# Patient Record
Sex: Female | Born: 1965 | Race: White | Hispanic: No | Marital: Married | Smoking: Current every day smoker
Health system: Southern US, Community
[De-identification: ages and names within clinical notes are randomized; demographics above are authoritative.]

## PROBLEM LIST (undated history)

## (undated) DIAGNOSIS — R29898 Other symptoms and signs involving the musculoskeletal system: Secondary | ICD-10-CM

## (undated) DIAGNOSIS — R112 Nausea with vomiting, unspecified: Secondary | ICD-10-CM

## (undated) DIAGNOSIS — Z973 Presence of spectacles and contact lenses: Secondary | ICD-10-CM

## (undated) DIAGNOSIS — Z9889 Other specified postprocedural states: Secondary | ICD-10-CM

## (undated) DIAGNOSIS — W57XXXA Bitten or stung by nonvenomous insect and other nonvenomous arthropods, initial encounter: Secondary | ICD-10-CM

## (undated) DIAGNOSIS — G4730 Sleep apnea, unspecified: Secondary | ICD-10-CM

## (undated) DIAGNOSIS — E785 Hyperlipidemia, unspecified: Secondary | ICD-10-CM

## (undated) DIAGNOSIS — T7840XA Allergy, unspecified, initial encounter: Secondary | ICD-10-CM

## (undated) DIAGNOSIS — E669 Obesity, unspecified: Secondary | ICD-10-CM

## (undated) DIAGNOSIS — K219 Gastro-esophageal reflux disease without esophagitis: Secondary | ICD-10-CM

## (undated) HISTORY — DX: Bitten or stung by nonvenomous insect and other nonvenomous arthropods, initial encounter: W57.XXXA

## (undated) HISTORY — PX: LAPAROSCOPIC GASTRIC SLEEVE RESECTION: SHX5895

## (undated) HISTORY — PX: TUBAL LIGATION: SHX77

## (undated) HISTORY — DX: Hyperlipidemia, unspecified: E78.5

## (undated) HISTORY — PX: OOPHORECTOMY: SHX86

## (undated) HISTORY — DX: Gastro-esophageal reflux disease without esophagitis: K21.9

## (undated) HISTORY — DX: Allergy, unspecified, initial encounter: T78.40XA

## (undated) HISTORY — PX: COSMETIC SURGERY: SHX468

## (undated) HISTORY — PX: NASAL SINUS SURGERY: SHX719

## (undated) HISTORY — PX: FOOT SURGERY: SHX648

## (undated) HISTORY — PX: HERNIA REPAIR: SHX51

---

## 1993-02-23 HISTORY — PX: VAGINAL HYSTERECTOMY: SUR661

## 1997-12-28 ENCOUNTER — Other Ambulatory Visit: Admission: RE | Admit: 1997-12-28 | Discharge: 1997-12-28 | Payer: Self-pay

## 2005-04-10 ENCOUNTER — Emergency Department: Payer: Self-pay | Admitting: Emergency Medicine

## 2005-06-03 ENCOUNTER — Ambulatory Visit: Payer: Self-pay | Admitting: Obstetrics and Gynecology

## 2006-04-26 ENCOUNTER — Ambulatory Visit: Payer: Self-pay | Admitting: Otolaryngology

## 2006-10-12 ENCOUNTER — Ambulatory Visit: Payer: Self-pay | Admitting: Family Medicine

## 2006-12-29 ENCOUNTER — Ambulatory Visit: Payer: Self-pay | Admitting: Family Medicine

## 2007-03-13 ENCOUNTER — Other Ambulatory Visit: Payer: Self-pay

## 2007-03-13 ENCOUNTER — Emergency Department: Payer: Self-pay | Admitting: Emergency Medicine

## 2008-07-04 ENCOUNTER — Ambulatory Visit: Payer: Self-pay | Admitting: Gastroenterology

## 2008-07-17 ENCOUNTER — Ambulatory Visit: Payer: Self-pay | Admitting: Gastroenterology

## 2008-12-23 ENCOUNTER — Emergency Department: Payer: Self-pay | Admitting: Unknown Physician Specialty

## 2009-04-11 ENCOUNTER — Emergency Department: Payer: Self-pay | Admitting: Emergency Medicine

## 2010-02-06 ENCOUNTER — Ambulatory Visit: Payer: Self-pay | Admitting: Family Medicine

## 2010-08-07 ENCOUNTER — Ambulatory Visit: Payer: Self-pay | Admitting: Family Medicine

## 2010-09-26 ENCOUNTER — Ambulatory Visit: Payer: Self-pay | Admitting: Urology

## 2010-11-13 ENCOUNTER — Ambulatory Visit: Payer: Self-pay | Admitting: Family Medicine

## 2011-04-21 ENCOUNTER — Ambulatory Visit: Payer: Self-pay | Admitting: Family Medicine

## 2011-08-01 ENCOUNTER — Emergency Department: Payer: Self-pay | Admitting: Emergency Medicine

## 2011-08-09 ENCOUNTER — Emergency Department: Payer: Self-pay | Admitting: Emergency Medicine

## 2011-08-11 ENCOUNTER — Ambulatory Visit: Payer: Self-pay | Admitting: Family Medicine

## 2012-04-27 ENCOUNTER — Ambulatory Visit: Payer: Self-pay | Admitting: Family Medicine

## 2012-06-20 LAB — HM PAP SMEAR: HM Pap smear: NORMAL

## 2013-05-02 ENCOUNTER — Ambulatory Visit: Payer: Self-pay | Admitting: Family Medicine

## 2013-05-02 LAB — HM MAMMOGRAPHY: HM Mammogram: NORMAL

## 2013-12-05 ENCOUNTER — Ambulatory Visit: Payer: Self-pay | Admitting: Family Medicine

## 2014-06-20 ENCOUNTER — Other Ambulatory Visit: Payer: Self-pay | Admitting: Family Medicine

## 2014-06-20 DIAGNOSIS — Z1231 Encounter for screening mammogram for malignant neoplasm of breast: Secondary | ICD-10-CM

## 2014-06-20 LAB — LIPID PANEL
Cholesterol: 186 mg/dL (ref 0–200)
HDL: 38 mg/dL (ref 35–70)
LDL CALC: 127 mg/dL
Triglycerides: 107 mg/dL (ref 40–160)

## 2014-06-20 LAB — HEPATIC FUNCTION PANEL
ALT: 57 U/L — AB (ref 7–35)
AST: 46 U/L — AB (ref 13–35)

## 2014-06-20 LAB — BASIC METABOLIC PANEL
BUN: 11 mg/dL (ref 4–21)
Creatinine: 0.6 mg/dL (ref <=1.1)
Glucose: 100 mg/dL

## 2014-07-06 DIAGNOSIS — R030 Elevated blood-pressure reading, without diagnosis of hypertension: Secondary | ICD-10-CM | POA: Insufficient documentation

## 2014-07-06 DIAGNOSIS — G5600 Carpal tunnel syndrome, unspecified upper limb: Secondary | ICD-10-CM | POA: Insufficient documentation

## 2014-07-06 DIAGNOSIS — Z0000 Encounter for general adult medical examination without abnormal findings: Secondary | ICD-10-CM | POA: Insufficient documentation

## 2014-07-06 DIAGNOSIS — J449 Chronic obstructive pulmonary disease, unspecified: Secondary | ICD-10-CM | POA: Insufficient documentation

## 2014-07-06 DIAGNOSIS — B079 Viral wart, unspecified: Secondary | ICD-10-CM | POA: Insufficient documentation

## 2014-07-06 DIAGNOSIS — E7849 Other hyperlipidemia: Secondary | ICD-10-CM | POA: Insufficient documentation

## 2014-07-06 DIAGNOSIS — J440 Chronic obstructive pulmonary disease with acute lower respiratory infection: Secondary | ICD-10-CM

## 2014-07-06 DIAGNOSIS — E668 Other obesity: Secondary | ICD-10-CM | POA: Insufficient documentation

## 2014-07-06 DIAGNOSIS — F17200 Nicotine dependence, unspecified, uncomplicated: Secondary | ICD-10-CM | POA: Insufficient documentation

## 2014-07-09 ENCOUNTER — Ambulatory Visit: Payer: Self-pay

## 2014-07-11 ENCOUNTER — Ambulatory Visit
Admission: RE | Admit: 2014-07-11 | Discharge: 2014-07-11 | Disposition: A | Discharge status: Home / Self Care | Payer: 59 | Source: Ambulatory Visit | Attending: Family Medicine | Admitting: Family Medicine

## 2014-07-11 DIAGNOSIS — Z1231 Encounter for screening mammogram for malignant neoplasm of breast: Secondary | ICD-10-CM | POA: Diagnosis not present

## 2014-08-06 ENCOUNTER — Other Ambulatory Visit: Payer: 59

## 2014-08-06 DIAGNOSIS — R945 Abnormal results of liver function studies: Principal | ICD-10-CM

## 2014-08-06 DIAGNOSIS — R7989 Other specified abnormal findings of blood chemistry: Secondary | ICD-10-CM

## 2014-08-07 LAB — HEPATIC FUNCTION PANEL
ALK PHOS: 74 IU/L (ref 39–117)
ALT: 44 IU/L — ABNORMAL HIGH (ref 0–32)
AST: 28 IU/L (ref 0–40)
Albumin: 4 g/dL (ref 3.5–5.5)
Bilirubin Total: 0.2 mg/dL (ref 0.0–1.2)
Bilirubin, Direct: 0.08 mg/dL (ref 0.00–0.40)
TOTAL PROTEIN: 6.8 g/dL (ref 6.0–8.5)

## 2014-11-15 ENCOUNTER — Ambulatory Visit
Admission: RE | Admit: 2014-11-15 | Discharge: 2014-11-15 | Disposition: A | Discharge status: Home / Self Care | Payer: 59 | Source: Ambulatory Visit | Attending: Family Medicine | Admitting: Family Medicine

## 2014-11-15 ENCOUNTER — Ambulatory Visit (INDEPENDENT_AMBULATORY_CARE_PROVIDER_SITE_OTHER): Payer: 59 | Admitting: Family Medicine

## 2014-11-15 ENCOUNTER — Encounter: Payer: Self-pay | Admitting: Family Medicine

## 2014-11-15 VITALS — BP 110/70 | HR 90 | Temp 98.5°F | Ht 66.0 in | Wt 275.0 lb

## 2014-11-15 DIAGNOSIS — J440 Chronic obstructive pulmonary disease with acute lower respiratory infection: Secondary | ICD-10-CM

## 2014-11-15 DIAGNOSIS — J40 Bronchitis, not specified as acute or chronic: Secondary | ICD-10-CM

## 2014-11-15 MED ORDER — GUAIFENESIN-CODEINE 100-10 MG/5ML PO SOLN: 5.0000 mL | Freq: Three times a day (TID) | ORAL | Status: DC | PRN

## 2014-11-15 MED ORDER — ALBUTEROL SULFATE (2.5 MG/3ML) 0.083% IN NEBU: 2.5000 mg | INHALATION_SOLUTION | Freq: Four times a day (QID) | RESPIRATORY_TRACT | Status: DC | PRN

## 2014-11-15 MED ORDER — LEVOFLOXACIN 500 MG PO TABS: 500.0000 mg | ORAL_TABLET | Freq: Every day | ORAL | Status: DC

## 2014-11-15 NOTE — Progress Notes (Signed)
Name: Sarah Pham   MRN: [id number]    DOB: Jul 03, 1965   Date:11/15/2014       Progress Note  Subjective  Chief Complaint  Chief Complaint  Patient presents with  . Cough    productive cough- brown , wheezing and fever/ chills    Cough This is a new problem. The current episode started in the past 7 days. The problem has been waxing and waning. The cough is productive of purulent sputum. Associated symptoms include chills, a fever, nasal congestion, sweats and wheezing. Pertinent negatives include no chest pain, ear congestion, ear pain, headaches, heartburn, myalgias, postnasal drip, rash, sore throat, shortness of breath or weight loss. The treatment provided mild relief. Her past medical history is significant for bronchitis and COPD. There is no history of environmental allergies.    No problem-specific assessment & plan notes found for this encounter.   Past Medical History  Diagnosis Date  . Hyperlipidemia     Past Surgical History  Procedure Laterality Date  . Foot surgery    . Nasal sinus surgery    . Vaginal hysterectomy  1995    Family History  Problem Relation Age of Onset  . Breast cancer Maternal Aunt   . Breast cancer Maternal Grandmother     Social History   Social History  . Marital Status: Married    Spouse Name: N/A  . Number of Children: N/A  . Years of Education: N/A   Occupational History  . Not on file.   Social History Main Topics  . Smoking status: Current Every Day Smoker  . Smokeless tobacco: Not on file  . Alcohol Use: 0.0 oz/week    0 Standard drinks or equivalent per week  . Drug Use: No  . Sexual Activity: Yes   Other Topics Concern  . Not on file   Social History Narrative    Allergies  Allergen Reactions  . Other Other (See Comments)    Tomatoes, dust and mold  . Sulfa Antibiotics Other (See Comments)  . Tomato      Review of Systems  Constitutional: Positive for fever and chills. Negative for weight loss and  malaise/fatigue.  HENT: Negative for ear discharge, ear pain, postnasal drip and sore throat.   Eyes: Negative for blurred vision.  Respiratory: Positive for cough and wheezing. Negative for sputum production and shortness of breath.   Cardiovascular: Negative for chest pain, palpitations and leg swelling.  Gastrointestinal: Negative for heartburn, nausea, abdominal pain, diarrhea, constipation, blood in stool and melena.  Genitourinary: Negative for dysuria, urgency, frequency and hematuria.  Musculoskeletal: Negative for myalgias, back pain, joint pain and neck pain.  Skin: Negative for rash.  Neurological: Negative for dizziness, tingling, sensory change, focal weakness and headaches.  Endo/Heme/Allergies: Negative for environmental allergies and polydipsia. Does not bruise/bleed easily.  Psychiatric/Behavioral: Negative for depression and suicidal ideas. The patient is not nervous/anxious and does not have insomnia.      Objective  Filed Vitals:   11/15/14 0956  BP: 110/70  Pulse: 90  Temp: 98.5 F (36.9 C)  TempSrc: Oral  Height: 5\' 6"  (1.676 m)  Weight: 275 lb (124.739 kg)  SpO2: 94%    Physical Exam  Constitutional: She is well-developed, well-nourished, and in no distress. No distress.  HENT:  Head: Normocephalic and atraumatic.  Right Ear: External ear normal.  Left Ear: External ear normal.  Nose: Nose normal.  Mouth/Throat: Oropharynx is clear and moist.  Eyes: Conjunctivae and EOM are normal. Pupils  are equal, round, and reactive to light. Right eye exhibits no discharge. Left eye exhibits no discharge.  Neck: Normal range of motion. Neck supple. No JVD present. No thyromegaly present.  Cardiovascular: Normal rate, regular rhythm, normal heart sounds and intact distal pulses.  Exam reveals no gallop and no friction rub.   No murmur heard. Pulmonary/Chest: Effort normal and breath sounds normal.  Abdominal: Soft. Bowel sounds are normal. She exhibits no mass.  There is no tenderness. There is no guarding.  Musculoskeletal: Normal range of motion. She exhibits no edema.  Lymphadenopathy:    She has no cervical adenopathy.  Neurological: She is alert. She has normal reflexes.  Skin: Skin is warm and dry. She is not diaphoretic.  Psychiatric: Mood and affect normal.      Assessment & Plan  Problem List Items Addressed This Visit      Respiratory   Chronic obstructive pulmonary disease with acute lower resp infection - Primary   Relevant Medications   albuterol (PROVENTIL) (2.5 MG/3ML) 0.083% nebulizer solution   guaiFENesin-codeine 100-10 MG/5ML syrup   Other Relevant Orders   DG Chest 2 View    Other Visit Diagnoses    Bronchitis        Relevant Medications    levofloxacin (LEVAQUIN) 500 MG tablet    guaiFENesin-codeine 100-10 MG/5ML syrup         Dr. Macon [facility]  11/15/2014

## 2015-04-11 ENCOUNTER — Ambulatory Visit
Admission: EM | Admit: 2015-04-11 | Discharge: 2015-04-11 | Disposition: A | Discharge status: Home / Self Care | Payer: 59 | Attending: Family Medicine | Admitting: Family Medicine

## 2015-04-11 DIAGNOSIS — N393 Stress incontinence (female) (male): Secondary | ICD-10-CM

## 2015-04-11 DIAGNOSIS — J0100 Acute maxillary sinusitis, unspecified: Secondary | ICD-10-CM

## 2015-04-11 DIAGNOSIS — R05 Cough: Secondary | ICD-10-CM | POA: Diagnosis not present

## 2015-04-11 DIAGNOSIS — R059 Cough, unspecified: Secondary | ICD-10-CM

## 2015-04-11 HISTORY — DX: Obesity, unspecified: E66.9

## 2015-04-11 MED ORDER — CETIRIZINE-PSEUDOEPHEDRINE ER 5-120 MG PO TB12: 1.0000 | ORAL_TABLET | Freq: Two times a day (BID) | ORAL | Status: DC | PRN

## 2015-04-11 MED ORDER — FLUTICASONE PROPIONATE 50 MCG/ACT NA SUSP: 2.0000 | Freq: Every day | NASAL | Status: DC

## 2015-04-11 MED ORDER — AMOXICILLIN-POT CLAVULANATE 875-125 MG PO TABS: 1.0000 | ORAL_TABLET | Freq: Two times a day (BID) | ORAL | Status: DC

## 2015-04-11 MED ORDER — HYDROCOD POLST-CPM POLST ER 10-8 MG/5ML PO SUER: 5.0000 mL | Freq: Two times a day (BID) | ORAL | Status: DC | PRN

## 2015-04-11 NOTE — ED Notes (Signed)
Facial pain, mucus, cough.   Started "feeling bad" Sunday.  Cough started Tuesday.  Yellow and green mucus reported.  Unknown sick contacts.

## 2015-04-11 NOTE — ED Provider Notes (Addendum)
CSN: [id number]     Arrival date & time 04/11/15  20 History   First MD Initiated Contact with Patient 04/11/15 1928    Nurses notes were reviewed. Chief Complaint  Patient presents with  . Facial Pain  . Cough   Patient is here because of sinus pain and pressure. She states everything started about 5 days ago but in the last 72 hours things have gotten worse. She reports nasal congestion irritation without now she started to cough and with cough she's having some urinary incontinence. She states is green which she is blowing out of her nose and is getting thicker. She is unable to really bring much out from her lungs. Unfortunately she has a history hyperlipidemia as well as obesity maternal aunt and grandmother both had breast cancer. She does smoke on a daily basis pack a day.    (Consider location/radiation/quality/duration/timing/severity/associated sxs/prior Treatment) Patient is a 50 y.o. female presenting with cough. The history is provided by the patient. No language interpreter was used.  Cough Cough characteristics:  Productive and non-productive Sputum characteristics:  Green Severity:  Moderate Timing:  Constant Progression:  Worsening Chronicity:  New Smoker: yes   Context: smoke exposure and upper respiratory infection   Context: not occupational exposure   Relieved by:  Nothing Worsened by:  Environmental changes Ineffective treatments:  Decongestant and cough suppressants Associated symptoms: ear pain, rhinorrhea and sinus congestion   Associated symptoms: no chest pain, no myalgias, no rash, no shortness of breath and no sore throat   Risk factors: recent infection   Risk factors: no chemical exposure and no recent travel     Past Medical History  Diagnosis Date  . Hyperlipidemia   . Obesity    Past Surgical History  Procedure Laterality Date  . Foot surgery    . Nasal sinus surgery    . Vaginal hysterectomy  1995   Family History  Problem Relation Age  of Onset  . Breast cancer Maternal Aunt   . Breast cancer Maternal Grandmother    Social History  Substance Use Topics  . Smoking status: Current Every Day Smoker -- 1.00 packs/day    Types: Cigarettes  . Smokeless tobacco: None  . Alcohol Use: No   OB History    No data available     Review of Systems  HENT: Positive for ear pain and rhinorrhea. Negative for sore throat.   Respiratory: Positive for cough. Negative for shortness of breath.   Cardiovascular: Negative for chest pain.  Musculoskeletal: Negative for myalgias.  Skin: Negative for rash.  All other systems reviewed and are negative.   Allergies  Other; Sulfa antibiotics; and Tomato  Home Medications   Prior to Admission medications   Medication Sig Start Date End Date Taking? Authorizing Provider  cetirizine (ZYRTEC) 10 MG tablet Take 1 tablet by mouth daily.   Yes Historical Provider, MD  albuterol (PROVENTIL) (2.5 MG/3ML) 0.083% nebulizer solution Take 3 mLs (2.5 mg total) by nebulization every 6 (six) hours as needed for wheezing or shortness of breath. 11/15/14   Juline Patch, MD  amoxicillin-clavulanate (AUGMENTIN) 875-125 MG tablet Take 1 tablet by mouth 2 (two) times daily. 04/11/15   Frederich Cha, MD  cetirizine-pseudoephedrine (ZYRTEC-D) 5-120 MG tablet Take 1 tablet by mouth 2 (two) times daily as needed for allergies. 04/11/15   Frederich Cha, MD  chlorpheniramine-HYDROcodone [facility] PENNKINETIC ER) 10-8 MG/5ML SUER Take 5 mLs by mouth every 12 (twelve) hours as needed for cough. 04/11/15  Frederich Cha, MD  fluticasone [facility]) 50 MCG/ACT nasal spray Place 2 sprays into both nostrils daily. 04/11/15   Frederich Cha, MD  guaiFENesin-codeine 100-10 MG/5ML syrup Take 5 mLs by mouth 3 (three) times daily as needed. 11/15/14   Juline Patch, MD  levofloxacin (LEVAQUIN) 500 MG tablet Take 1 tablet (500 mg total) by mouth daily. 11/15/14   Juline Patch, MD   Meds Ordered and Administered this Visit  Medications -  No data to display  BP 128/88 mmHg  Pulse 94  Temp(Src) 97.9 F (36.6 C) (Oral)  Resp 16  SpO2 100% No data found.   Physical Exam  Constitutional: She appears well-developed and well-nourished.  HENT:  Head: Normocephalic and atraumatic.  Right Ear: Hearing, tympanic membrane, external ear and ear canal normal.  Left Ear: Hearing, external ear and ear canal normal. Tympanic membrane is erythematous.  Nose: Mucosal edema and rhinorrhea present. Right sinus exhibits maxillary sinus tenderness.  Mouth/Throat: No dental caries. Posterior oropharyngeal erythema present.  Eyes: Pupils are equal, round, and reactive to light.  Neck: Neck supple. No tracheal deviation present.  Cardiovascular: Normal rate and regular rhythm.   Pulmonary/Chest: Effort normal and breath sounds normal. No respiratory distress.  Musculoskeletal: Normal range of motion.  Lymphadenopathy:    She has cervical adenopathy.  Neurological: She is alert.  Skin: Skin is warm.  Psychiatric: She has a normal mood and affect.  Vitals reviewed.   ED Course  Procedures (including critical care time)  Labs Review Labs Reviewed - No data to display  Imaging Review No results found.   Visual Acuity Review  Right Eye Distance:   Left Eye Distance:   Bilateral Distance:    Right Eye Near:   Left Eye Near:    Bilateral Near:         MDM   1. Acute maxillary sinusitis, recurrence not specified   2. Cough   3. SI (stress incontinence), female    Explained patient normally we don't treat sinus infections about 7-10 days been present she looks somewhat miserable and because of the urinary incontinence. Will treat with Augmentin 875 one tablet twice a day Allegra-D and Flonase nasal spray and Tussionex 1 teaspoon twice a day.    Frederich Cha, MD 04/11/15 Lona Kettle  Frederich Cha, MD 04/11/15 [phone]

## 2015-04-11 NOTE — Discharge Instructions (Signed)
Cough, Adult A cough helps to clear your throat and lungs. A cough may last only 2-3 weeks (acute), or it may last longer than 8 weeks (chronic). Many different things can cause a cough. A cough may be a sign of an illness or another medical condition. HOME CARE  Pay attention to any changes in your cough.  Take medicines only as told by your doctor.  If you were prescribed an antibiotic medicine, take it as told by your doctor. Do not stop taking it even if you start to feel better.  Talk with your doctor before you try using a cough medicine.  Drink enough fluid to keep your pee (urine) clear or pale yellow.  If the air is dry, use a cold steam vaporizer or humidifier in your home.  Stay away from things that make you cough at work or at home.  If your cough is worse at night, try using extra pillows to raise your head up higher while you sleep.  Do not smoke, and try not to be around smoke. If you need help quitting, ask your doctor.  Do not have caffeine.  Do not drink alcohol.  Rest as needed. GET HELP IF: 1. You have new problems (symptoms). 2. You cough up yellow fluid (pus). 3. Your cough does not get better after 2-3 weeks, or your cough gets worse. 4. Medicine does not help your cough and you are not sleeping well. 5. You have pain that gets worse or pain that is not helped with medicine. 6. You have a fever. 7. You are losing weight and you do not know why. 8. You have night sweats. GET HELP RIGHT AWAY IF:  You cough up blood.  You have trouble breathing.  Your heartbeat is very fast.   This information is not intended to replace advice given to you by your health care provider. Make sure you discuss any questions you have with your health care provider.   Document Released: 10/23/2010 Document Revised: 10/31/2014 Document Reviewed: 04/18/2014 Elsevier Interactive Patient Education 2016 [employer]  Sinusitis, Adult Sinusitis is redness, soreness, and  puffiness (inflammation) of the air pockets in the bones of your face (sinuses). The redness, soreness, and puffiness can cause air and mucus to get trapped in your sinuses. This can allow germs to grow and cause an infection.  HOME CARE   Drink enough fluids to keep your pee (urine) clear or pale yellow.  Use a humidifier in your home.  Run a hot shower to create steam in the bathroom. Sit in the bathroom with the door closed. Breathe in the steam 3-4 times a day.  Put a warm, moist washcloth on your face 3-4 times a day, or as told by your doctor.  Use salt water sprays (saline sprays) to wet the thick fluid in your nose. This can help the sinuses drain.  Only take medicine as told by your doctor. GET HELP RIGHT AWAY IF:  9. Your pain gets worse. 10. You have very bad headaches. 11. You are sick to your stomach (nauseous). 12. You throw up (vomit). 13. You are very sleepy (drowsy) all the time. 14. Your face is puffy (swollen). 15. Your vision changes. 16. You have a stiff neck. 17. You have trouble breathing. MAKE SURE YOU:   Understand these instructions.  Will watch your condition.  Will get help right away if you are not doing well or get worse.   This information is not intended to replace advice  given to you by your health care provider. Make sure you discuss any questions you have with your health care provider.   Document Released: 07/29/2007 Document Revised: 03/02/2014 Document Reviewed: 09/15/2011 Elsevier Interactive Patient Education 2016 Elsevier Inc.  Urinary Incontinence Urinary incontinence is the involuntary loss of urine from your bladder. CAUSES  There are many causes of urinary incontinence. They include:  Medicines.  Infections.  Prostatic enlargement, leading to overflow of urine from your bladder.  Surgery.  Neurological diseases.  Emotional factors. SIGNS AND SYMPTOMS Urinary Incontinence can be divided into four types: 18. Urge  incontinence. Urge incontinence is the involuntary loss of urine before you have the opportunity to go to the bathroom. There is a sudden urge to void but not enough time to reach a bathroom. 19. Stress incontinence. Stress incontinence is the sudden loss of urine with any activity that forces urine to pass. It is commonly caused by anatomical changes to the pelvis and sphincter areas of your body. 20. Overflow incontinence. Overflow incontinence is the loss of urine from an obstructed opening to your bladder. This results in a backup of urine and a resultant buildup of pressure within the bladder. When the pressure within the bladder exceeds the closing pressure of the sphincter, the urine overflows, which causes incontinence, similar to water overflowing a dam. 21. Total incontinence. Total incontinence is the loss of urine as a result of the inability to store urine within your bladder. DIAGNOSIS  Evaluating the cause of incontinence may require:  A thorough and complete medical and obstetric history.  A complete physical exam.  Laboratory tests such as a urine culture and sensitivities. When additional tests are indicated, they can include:  An ultrasound exam.  Kidney and bladder X-rays.  Cystoscopy. This is an exam of the bladder using a narrow scope.  Urodynamic testing to test the nerve function to the bladder and sphincter areas. TREATMENT  Treatment for urinary incontinence depends on the cause:  For urge incontinence caused by a bacterial infection, antibiotics will be prescribed. If the urge incontinence is related to medicines you take, your health care provider may have you change the medicine.  For stress incontinence, surgery to re-establish anatomical support to the bladder or sphincter, or both, will often correct the condition.  For overflow incontinence caused by an enlarged prostate, an operation to open the channel through the enlarged prostate will allow the flow of  urine out of the bladder. In women with fibroids, a hysterectomy may be recommended.  For total incontinence, surgery on your urinary sphincter may help. An artificial urinary sphincter (an inflatable cuff placed around the urethra) may be required. In women who have developed a hole-like passage between their bladder and vagina (vesicovaginal fistula), surgery to close the fistula often is required. HOME CARE INSTRUCTIONS  Normal daily hygiene and the use of pads or adult diapers that are changed regularly will help prevent odors and skin damage.  Avoid caffeine. It can overstimulate your bladder.  Use the bathroom regularly. Try about every 2-3 hours to go to the bathroom, even if you do not feel the need to do so. Take time to empty your bladder completely. After urinating, wait a minute. Then try to urinate again.  For causes involving nerve dysfunction, keep a log of the medicines you take and a journal of the times you go to the bathroom. SEEK MEDICAL CARE IF:  You experience worsening of pain instead of improvement in pain after your procedure.  Your  incontinence becomes worse instead of better. SEE IMMEDIATE MEDICAL CARE IF:  You experience fever or shaking chills.  You are unable to pass your urine.  You have redness spreading into your groin or down into your thighs. MAKE SURE YOU:   Understand these instructions.   Will watch your condition.  Will get help right away if you are not doing well or get worse.   This information is not intended to replace advice given to you by your health care provider. Make sure you discuss any questions you have with your health care provider.   Document Released: 03/19/2004 Document Revised: 03/02/2014 Document Reviewed: 07/19/2012 Elsevier Interactive Patient Education [employer].

## 2015-04-11 NOTE — ED Notes (Signed)
Ambulatory to TR.  In NAD.  Not photophobic. Reporting headpain.

## 2015-07-24 ENCOUNTER — Ambulatory Visit
Admission: EM | Admit: 2015-07-24 | Discharge: 2015-07-24 | Disposition: A | Discharge status: Home / Self Care | Payer: 59 | Attending: Family Medicine | Admitting: Family Medicine

## 2015-07-24 DIAGNOSIS — J411 Mucopurulent chronic bronchitis: Secondary | ICD-10-CM | POA: Diagnosis not present

## 2015-07-24 DIAGNOSIS — R062 Wheezing: Secondary | ICD-10-CM

## 2015-07-24 DIAGNOSIS — L0292 Furuncle, unspecified: Secondary | ICD-10-CM | POA: Diagnosis not present

## 2015-07-24 MED ORDER — PREDNISONE 20 MG PO TABS: ORAL_TABLET | ORAL | Status: DC

## 2015-07-24 MED ORDER — DOXYCYCLINE HYCLATE 100 MG PO TABS: 100.0000 mg | ORAL_TABLET | Freq: Two times a day (BID) | ORAL | Status: DC

## 2015-07-24 MED ORDER — GUAIFENESIN-CODEINE 100-10 MG/5ML PO SOLN: 10.0000 mL | Freq: Four times a day (QID) | ORAL | Status: DC | PRN

## 2015-07-24 MED ORDER — ALBUTEROL SULFATE HFA 108 (90 BASE) MCG/ACT IN AERS: 1.0000 | INHALATION_SPRAY | Freq: Four times a day (QID) | RESPIRATORY_TRACT | Status: DC | PRN

## 2015-07-24 NOTE — ED Notes (Signed)
Patient complains of cough-productive, congestion, wheezing. Patient states that symptoms started 2 days ago. Patient states that she also has an abscess on her side where her bra lays.

## 2015-07-24 NOTE — Discharge Instructions (Signed)
Chronic Obstructive Pulmonary Disease Chronic obstructive pulmonary disease (COPD) is a common lung condition in which airflow from the lungs is limited. COPD is a general term that can be used to describe many different lung problems that limit airflow, including both chronic bronchitis and emphysema. If you have COPD, your lung function will probably never return to normal, but there are measures you can take to improve lung function and make yourself feel better. CAUSES   Smoking (common).  Exposure to secondhand smoke.  Genetic problems.  Chronic inflammatory lung diseases or recurrent infections. SYMPTOMS  Shortness of breath, especially with physical activity.  Deep, persistent (chronic) cough with a large amount of thick mucus.  Wheezing.  Rapid breaths (tachypnea).  Gray or bluish discoloration (cyanosis) of the skin, especially in your fingers, toes, or lips.  Fatigue.  Weight loss.  Frequent infections or episodes when breathing symptoms become much worse (exacerbations).  Chest tightness. DIAGNOSIS Your health care provider will take a medical history and perform a physical examination to diagnose COPD. Additional tests for COPD may include:  Lung (pulmonary) function tests.  Chest X-ray.  CT scan.  Blood tests. TREATMENT  Treatment for COPD may include:  Inhaler and nebulizer medicines. These help manage the symptoms of COPD and make your breathing more comfortable.  Supplemental oxygen. Supplemental oxygen is only helpful if you have a low oxygen level in your blood.  Exercise and physical activity. These are beneficial for nearly all people with COPD.  Lung surgery or transplant.  Nutrition therapy to gain weight, if you are underweight.  Pulmonary rehabilitation. This may involve working with a team of health care providers and specialists, such as respiratory, occupational, and physical therapists. HOME CARE INSTRUCTIONS  Take all medicines  (inhaled or pills) as directed by your health care provider.  Avoid over-the-counter medicines or cough syrups that dry up your airway (such as antihistamines) and slow down the elimination of secretions unless instructed otherwise by your health care provider.  If you are a smoker, the most important thing that you can do is stop smoking. Continuing to smoke will cause further lung damage and breathing trouble. Ask your health care provider for help with quitting smoking. He or she can direct you to community resources or hospitals that provide support.  Avoid exposure to irritants such as smoke, chemicals, and fumes that aggravate your breathing.  Use oxygen therapy and pulmonary rehabilitation if directed by your health care provider. If you require home oxygen therapy, ask your health care provider whether you should purchase a pulse oximeter to measure your oxygen level at home.  Avoid contact with individuals who have a contagious illness.  Avoid extreme temperature and humidity changes.  Eat healthy foods. Eating smaller, more frequent meals and resting before meals may help you maintain your strength.  Stay active, but balance activity with periods of rest. Exercise and physical activity will help you maintain your ability to do things you want to do.  Preventing infection and hospitalization is very important when you have COPD. Make sure to receive all the vaccines your health care provider recommends, especially the pneumococcal and influenza vaccines. Ask your health care provider whether you need a pneumonia vaccine.  Learn and use relaxation techniques to manage stress.  Learn and use controlled breathing techniques as directed by your health care provider. Controlled breathing techniques include:  Pursed lip breathing. Start by breathing in (inhaling) through your nose for 1 second. Then, purse your lips as if you were  going to whistle and breathe out (exhale) through the  pursed lips for 2 seconds.  Diaphragmatic breathing. Start by putting one hand on your abdomen just above your waist. Inhale slowly through your nose. The hand on your abdomen should move out. Then purse your lips and exhale slowly. You should be able to feel the hand on your abdomen moving in as you exhale.  Learn and use controlled coughing to clear mucus from your lungs. Controlled coughing is a series of short, progressive coughs. The steps of controlled coughing are:  Lean your head slightly forward.  Breathe in deeply using diaphragmatic breathing.  Try to hold your breath for 3 seconds.  Keep your mouth slightly open while coughing twice.  Spit any mucus out into a tissue.  Rest and repeat the steps once or twice as needed. SEEK MEDICAL CARE IF:  You are coughing up more mucus than usual.  There is a change in the color or thickness of your mucus.  Your breathing is more labored than usual.  Your breathing is faster than usual. SEEK IMMEDIATE MEDICAL CARE IF:  You have shortness of breath while you are resting.  You have shortness of breath that prevents you from:  Being able to talk.  Performing your usual physical activities.  You have chest pain lasting longer than 5 minutes.  Your skin color is more cyanotic than usual.  You measure low oxygen saturations for longer than 5 minutes with a pulse oximeter. MAKE SURE YOU:  Understand these instructions.  Will watch your condition.  Will get help right away if you are not doing well or get worse.   This information is not intended to replace advice given to you by your health care provider. Make sure you discuss any questions you have with your health care provider.   Document Released: 11/19/2004 Document Revised: 03/02/2014 Document Reviewed: 10/06/2012 Elsevier Interactive Patient Education 2016 [employer]  Chronic Bronchitis Chronic bronchitis is a lasting inflammation of the bronchial tubes, which  are the tubes that carry air into your lungs. This is inflammation that occurs:   On most days of the week.   For at least three months at a time.   Over a period of two years in a row. When the bronchial tubes are inflamed, they start to produce mucus. The inflammation and buildup of mucus make it more difficult to breathe. Chronic bronchitis is usually a permanent problem and is one type of chronic obstructive pulmonary disease (COPD). People with chronic bronchitis are at greater risk for getting repeated colds, or respiratory infections. CAUSES  Chronic bronchitis most often occurs in people who have:  Long-standing, severe asthma.  A history of smoking.  Asthma and who also smoke. SIGNS AND SYMPTOMS  Chronic bronchitis may cause the following:   A cough that brings up mucus (productive cough).  Shortness of breath.  Early morning headache.  Wheezing.  Chest discomfort.   Recurring respiratory infections. DIAGNOSIS  Your health care provider may confirm the diagnosis by:  Taking your medical history.  Performing a physical exam.  Taking a chest X-ray.   Performing pulmonary function tests. TREATMENT  Treatment involves controlling symptoms with medicines, oxygen therapy, or making lifestyle changes, such as exercising and eating a healthy, well-balanced diet. Medicines could include:  Inhalers to improve air flow in and out of your lungs.  Antibiotics to treat bacterial infections, such as pneumonia, sinus infections, and acute bronchitis. As a preventative measure, your health care provider may  recommend routine vaccinations for influenza and pneumonia. This is to prevent infection and hospitalization since you may be more at risk for these types of infections.  HOME CARE INSTRUCTIONS  Take medicines only as directed by your health care provider.   If you smoke cigarettes, chew tobacco, or use electronic cigarettes, quit. If you need help quitting, ask  your health care provider.  Avoid pollen, dust, animal dander, molds, smoke, and other things that cause shortness of breath or wheezing attacks.  Talk to your health care provider about possible exercise routines. Regular exercise is very important to help you feel better.  If you are prescribed oxygen use at home follow these guidelines:  Never smoke while using oxygen. Oxygen does not burn or explode, but flammable materials will burn faster in the presence of oxygen.  Keep a Data processing manager close by. Let your fire department know that you have oxygen in your home.  Warn visitors not to smoke near you when you are using oxygen. Put up "no smoking" signs in your home where you most often use the oxygen.  Regularly test your smoke detectors at home to make sure they work. If you receive care in your home from a nurse or other health care provider, he or she may also check to make sure your smoke detectors work.  Ask your health care provider whether you would benefit from a pulmonary rehabilitation program.  Do not wait to get medical care if you have any concerning symptoms. Delays could cause permanent injury and may be life threatening. SEEK MEDICAL CARE IF:  You have increased coughing or shortness of breath or both.  You have muscle aches.  You have chest pain.  Your mucus gets thicker.  Your mucus changes from clear or white to yellow, green, gray, or bloody. SEEK IMMEDIATE MEDICAL CARE IF:  Your usual medicines do not stop your wheezing.   You have increased difficulty breathing.   You have any problems with the medicine you are taking, such as a rash, itching, swelling, or trouble breathing. MAKE SURE YOU:   Understand these instructions.  Will watch your condition.  Will get help right away if you are not doing well or get worse.   This information is not intended to replace advice given to you by your health care provider. Make sure you discuss any  questions you have with your health care provider.   Document Released: 11/27/2005 Document Revised: 03/02/2014 Document Reviewed: 03/20/2013 Elsevier Interactive Patient Education [employer].

## 2015-08-23 ENCOUNTER — Ambulatory Visit: Payer: 59 | Admitting: Family Medicine

## 2015-09-11 NOTE — ED Provider Notes (Signed)
CSN: [id number]     Arrival date & time 07/24/15  1202 History   First MD Initiated Contact with Patient 07/24/15 1327     Chief Complaint  Patient presents with  . Abscess  . Cough   (Consider location/radiation/quality/duration/timing/severity/associated sxs/prior Treatment) Patient is a 50 y.o. female presenting with abscess, cough, and URI. The history is provided by the patient.  Abscess Location:  Torso Torso abscess location:  L chest Abscess quality: draining   Red streaking: yes   Duration:  4 days Progression:  Worsening Chronicity:  New Context: skin injury   Associated symptoms: fever and headaches   Cough Associated symptoms: fever, headaches and wheezing   URI Presenting symptoms: congestion, cough and fever   Severity:  Moderate Onset quality:  Sudden Duration:  1 week Timing:  Constant Progression:  Worsening Chronicity:  New Relieved by:  Nothing Ineffective treatments:  OTC medications Associated symptoms: headaches and wheezing   Risk factors: not elderly, no chronic cardiac disease, no chronic kidney disease, no chronic respiratory disease, no diabetes mellitus, no immunosuppression, no recent illness, no recent travel and no sick contacts     Past Medical History  Diagnosis Date  . Hyperlipidemia   . Obesity    Past Surgical History  Procedure Laterality Date  . Foot surgery    . Nasal sinus surgery    . Vaginal hysterectomy  1995   Family History  Problem Relation Age of Onset  . Breast cancer Maternal Aunt   . Breast cancer Maternal Grandmother    Social History  Substance Use Topics  . Smoking status: Current Every Day Smoker -- 1.00 packs/day    Types: Cigarettes  . Smokeless tobacco: None  . Alcohol Use: No   OB History    No data available     Review of Systems  Constitutional: Positive for fever.  HENT: Positive for congestion.   Respiratory: Positive for cough and wheezing.   Neurological: Positive for headaches.     Allergies  Other; Sulfa antibiotics; and Tomato  Home Medications   Prior to Admission medications   Medication Sig Start Date End Date Taking? Authorizing Provider  cetirizine (ZYRTEC) 10 MG tablet Take 1 tablet by mouth daily.   Yes Historical Provider, MD  albuterol (PROVENTIL HFA;VENTOLIN HFA) 108 (90 Base) MCG/ACT inhaler Inhale 1-2 puffs into the lungs every 6 (six) hours as needed for wheezing or shortness of breath. 07/24/15   Norval Gable, MD  amoxicillin-clavulanate (AUGMENTIN) 875-125 MG tablet Take 1 tablet by mouth 2 (two) times daily. 04/11/15   Frederich Cha, MD  cetirizine-pseudoephedrine (ZYRTEC-D) 5-120 MG tablet Take 1 tablet by mouth 2 (two) times daily as needed for allergies. 04/11/15   Frederich Cha, MD  chlorpheniramine-HYDROcodone [facility] PENNKINETIC ER) 10-8 MG/5ML SUER Take 5 mLs by mouth every 12 (twelve) hours as needed for cough. 04/11/15   Frederich Cha, MD  doxycycline (VIBRA-TABS) 100 MG tablet Take 1 tablet (100 mg total) by mouth 2 (two) times daily. 07/24/15   Norval Gable, MD  fluticasone (FLONASE) 50 MCG/ACT nasal spray Place 2 sprays into both nostrils daily. 04/11/15   Frederich Cha, MD  guaiFENesin-codeine 100-10 MG/5ML syrup Take 10 mLs by mouth every 6 (six) hours as needed for cough. 07/24/15   Norval Gable, MD  levofloxacin (LEVAQUIN) 500 MG tablet Take 1 tablet (500 mg total) by mouth daily. 11/15/14   Juline Patch, MD  predniSONE (DELTASONE) 20 MG tablet 2 tabs po qd for 5 days 07/24/15  Norval Gable, MD   Meds Ordered and Administered this Visit  Medications - No data to display  BP 127/80 mmHg  Pulse 94  Temp(Src) 98.3 F (36.8 C) (Oral)  Resp 18  Ht 5\' 7"  (1.702 m)  Wt 265 lb (120.203 kg)  BMI 41.50 kg/m2  SpO2 95% No data found.   Physical Exam  Constitutional: She appears well-developed and well-nourished. No distress.  HENT:  Head: Normocephalic and atraumatic.  Right Ear: Tympanic membrane, external ear and ear canal normal.   Left Ear: Tympanic membrane, external ear and ear canal normal.  Nose: Mucosal edema and rhinorrhea present. No nose lacerations, sinus tenderness, nasal deformity, septal deviation or nasal septal hematoma. No epistaxis.  No foreign bodies.  Mouth/Throat: Uvula is midline, oropharynx is clear and moist and mucous membranes are normal. No oropharyngeal exudate.  Eyes: Conjunctivae and EOM are normal. Pupils are equal, round, and reactive to light. Right eye exhibits no discharge. Left eye exhibits no discharge. No scleral icterus.  Neck: Normal range of motion. Neck supple. No thyromegaly present.  Cardiovascular: Normal rate, regular rhythm and normal heart sounds.   Pulmonary/Chest: Effort normal. No respiratory distress. She has wheezes. She has no rales.  Lymphadenopathy:    She has no cervical adenopathy.  Skin: She is not diaphoretic.  Erythematous tender and slightly purulent draining skin lesion on chest wall skin at edge of bra line  Nursing note and vitals reviewed.   ED Course  Procedures (including critical care time)  Labs Review Labs Reviewed - No data to display  Imaging Review No results found.   Visual Acuity Review  Right Eye Distance:   Left Eye Distance:   Bilateral Distance:    Right Eye Near:   Left Eye Near:    Bilateral Near:         MDM   1. Mucopurulent chronic bronchitis ([place])   2. Wheezing   3. Boil    Discharge Medication List as of 07/24/2015  1:56 PM    START taking these medications   Details  albuterol (PROVENTIL HFA;VENTOLIN HFA) 108 (90 Base) MCG/ACT inhaler Inhale 1-2 puffs into the lungs every 6 (six) hours as needed for wheezing or shortness of breath., Starting 07/24/2015, Until Discontinued, Normal    doxycycline (VIBRA-TABS) 100 MG tablet Take 1 tablet (100 mg total) by mouth 2 (two) times daily., Starting 07/24/2015, Until Discontinued, Normal    predniSONE (DELTASONE) 20 MG tablet 2 tabs po qd for 5 days, Normal        1. diagnosis reviewed with patient/parent/guardian/family 2. rx as per orders above; reviewed possible side effects, interactions, risks and benefits  3. Recommend supportive treatment with warm compresses 4. Follow-up prn if symptoms worsen or don't improve    Norval Gable, MD 09/11/15 1801

## 2016-03-02 ENCOUNTER — Ambulatory Visit
Admission: EM | Admit: 2016-03-02 | Discharge: 2016-03-02 | Disposition: A | Discharge status: Home / Self Care | Payer: 59 | Attending: Family Medicine | Admitting: Family Medicine

## 2016-03-02 DIAGNOSIS — J0101 Acute recurrent maxillary sinusitis: Secondary | ICD-10-CM

## 2016-03-02 MED ORDER — AMOXICILLIN-POT CLAVULANATE 875-125 MG PO TABS: 1.0000 | ORAL_TABLET | Freq: Two times a day (BID) | ORAL | 0 refills | Status: DC

## 2016-03-02 NOTE — ED Provider Notes (Signed)
CSN: [id number]     Arrival date & time 03/02/16  0955 History   First MD Initiated Contact with Patient 03/02/16 1135     Chief Complaint  Patient presents with  . Recurrent Sinusitis   (Consider location/radiation/quality/duration/timing/severity/associated sxs/prior Treatment) HPI  This a 51 year old female who has a previous history of COPD and recurrent maxillary sinusitis who presents with a 10 day history of headache and sinus pressure and pain and bloody nasal mucus. He continues to smoke a pack of cigarettes daily. She's had chills but has not had any fever. Over-the-counter medications have not been beneficial in helping control her symptoms.       Past Medical History:  Diagnosis Date  . Hyperlipidemia   . Obesity    Past Surgical History:  Procedure Laterality Date  . FOOT SURGERY    . NASAL SINUS SURGERY    . VAGINAL HYSTERECTOMY  1995   Family History  Problem Relation Age of Onset  . Breast cancer Maternal Aunt   . Breast cancer Maternal Grandmother    Social History  Substance Use Topics  . Smoking status: Current Every Day Smoker    Packs/day: 1.00    Types: Cigarettes  . Smokeless tobacco: Never Used  . Alcohol use No   OB History    No data available     Review of Systems  Constitutional: Positive for activity change, chills, fatigue and fever.  HENT: Positive for congestion, postnasal drip, rhinorrhea, sinus pain, sinus pressure and sore throat.   Respiratory: Positive for cough. Negative for wheezing and stridor.   All other systems reviewed and are negative.   Allergies  Other; Sulfa antibiotics; and Tomato  Home Medications   Prior to Admission medications   Medication Sig Start Date End Date Taking? Authorizing Provider  amoxicillin-clavulanate (AUGMENTIN) 875-125 MG tablet Take 1 tablet by mouth every 12 (twelve) hours. 03/02/16   Lorin Picket, PA-C   Meds Ordered and Administered this Visit  Medications - No data to display  BP  (!) 143/83 (BP Location: Left Arm)   Pulse 87   Temp 97.8 F (36.6 C) (Oral)   Resp 18   Ht 5\' 6"  (1.676 m)   Wt 235 lb (106.6 kg)   SpO2 98%   BMI 37.93 kg/m  No data found.   Physical Exam  Constitutional: She is oriented to person, place, and time. She appears well-developed and well-nourished. No distress.  HENT:  Head: Normocephalic and atraumatic.  Right Ear: External ear normal.  Left Ear: External ear normal.  Nose: Nose normal.  Mouth/Throat: Oropharynx is clear and moist.  .Patient has mild tenderness percussion of the frontal sinuses. Moderate tenderness over the maxillary sinuses more on the left.  Eyes: EOM are normal. Pupils are equal, round, and reactive to light. Right eye exhibits no discharge. Left eye exhibits no discharge.  Neck: Normal range of motion. Neck supple.  Pulmonary/Chest: Effort normal and breath sounds normal. No respiratory distress. She has no wheezes. She has no rales.  Musculoskeletal: Normal range of motion.  Lymphadenopathy:    She has no cervical adenopathy.  Neurological: She is alert and oriented to person, place, and time.  Skin: Skin is warm and dry. She is not diaphoretic.  Psychiatric: She has a normal mood and affect. Her behavior is normal. Judgment and thought content normal.  Nursing note and vitals reviewed.   Urgent Care Course   Clinical Course     Procedures (including critical care time)  Labs Review Labs Reviewed - No data to display  Imaging Review No results found.   Visual Acuity Review  Right Eye Distance:   Left Eye Distance:   Bilateral Distance:    Right Eye Near:   Left Eye Near:    Bilateral Near:         MDM   1. Acute recurrent maxillary sinusitis    Discharge Medication List as of 03/02/2016 11:57 AM    START taking these medications   Details  amoxicillin-clavulanate (AUGMENTIN) 875-125 MG tablet Take 1 tablet by mouth every 12 (twelve) hours., Starting Mon 03/02/2016, Normal       Plan: 1. Test/x-ray results and diagnosis reviewed with patient 2. rx as per orders; risks, benefits, potential side effects reviewed with patient 3. Recommend supportive treatment with Rest and fluids. Recommend that she be seen by an ENT specialist because of the recurrent nature of her sinusitis. I also recommended that she begin and continue to use Flonase for 3 weeks for drainage. She should follow-up with her primary care physician if she is not improving. 4. F/u prn if symptoms worsen or don't improve     Lorin Picket, PA-C 03/02/16 1203

## 2016-03-02 NOTE — ED Triage Notes (Signed)
Pt c/o headache, green mucus, head pressure and bloody snot.

## 2016-06-05 ENCOUNTER — Ambulatory Visit
Admission: RE | Admit: 2016-06-05 | Discharge: 2016-06-05 | Disposition: A | Discharge status: Home / Self Care | Payer: 59 | Source: Ambulatory Visit | Attending: Family Medicine | Admitting: Family Medicine

## 2016-06-05 ENCOUNTER — Ambulatory Visit (INDEPENDENT_AMBULATORY_CARE_PROVIDER_SITE_OTHER): Payer: 59 | Admitting: Family Medicine

## 2016-06-05 ENCOUNTER — Encounter: Payer: Self-pay | Admitting: Family Medicine

## 2016-06-05 VITALS — BP 120/80 | HR 80 | Ht 66.0 in | Wt 263.0 lb

## 2016-06-05 DIAGNOSIS — Z1272 Encounter for screening for malignant neoplasm of vagina: Secondary | ICD-10-CM

## 2016-06-05 DIAGNOSIS — Z1211 Encounter for screening for malignant neoplasm of colon: Secondary | ICD-10-CM

## 2016-06-05 DIAGNOSIS — E668 Other obesity: Secondary | ICD-10-CM

## 2016-06-05 DIAGNOSIS — Z1231 Encounter for screening mammogram for malignant neoplasm of breast: Secondary | ICD-10-CM | POA: Diagnosis not present

## 2016-06-05 DIAGNOSIS — Z0000 Encounter for general adult medical examination without abnormal findings: Secondary | ICD-10-CM

## 2016-06-05 DIAGNOSIS — R938 Abnormal findings on diagnostic imaging of other specified body structures: Secondary | ICD-10-CM | POA: Insufficient documentation

## 2016-06-05 DIAGNOSIS — R9389 Abnormal findings on diagnostic imaging of other specified body structures: Secondary | ICD-10-CM

## 2016-06-05 DIAGNOSIS — M7542 Impingement syndrome of left shoulder: Secondary | ICD-10-CM | POA: Diagnosis not present

## 2016-06-05 DIAGNOSIS — Z1239 Encounter for other screening for malignant neoplasm of breast: Secondary | ICD-10-CM

## 2016-06-05 LAB — POCT URINALYSIS DIPSTICK
BILIRUBIN UA: NEGATIVE
Glucose, UA: NEGATIVE
KETONES UA: NEGATIVE
Leukocytes, UA: NEGATIVE
NITRITE UA: NEGATIVE
RBC UA: NEGATIVE
SPEC GRAV UA: 1.02 (ref 1.010–1.025)
UROBILINOGEN UA: 0.2 U/dL
pH, UA: 6 (ref 5.0–8.0)

## 2016-06-05 LAB — HEMOCCULT GUIAC POC 1CARD (OFFICE): Fecal Occult Blood, POC: NEGATIVE

## 2016-06-05 NOTE — Progress Notes (Signed)
Name: Sarah Pham   MRN: [id number]    DOB: 07/16/1965   Date:06/05/2016       Progress Note  Subjective  Chief Complaint  Chief Complaint  Patient presents with  . Annual Exam    needs pap and mammo sched    Patient presents for annual physical exam.  Shoulder Pain   The pain is present in the left shoulder. This is a new problem. The current episode started 1 to 4 weeks ago. The problem occurs daily. The problem has been gradually worsening. The quality of the pain is described as aching. The pain is at a severity of 5/10. Pertinent negatives include no fever or tingling. The symptoms are aggravated by activity (abduction). She has tried nothing for the symptoms.    No problem-specific Assessment & Plan notes found for this encounter.   Past Medical History:  Diagnosis Date  . Hyperlipidemia   . Obesity     Past Surgical History:  Procedure Laterality Date  . FOOT SURGERY    . NASAL SINUS SURGERY    . VAGINAL HYSTERECTOMY  1995    Family History  Problem Relation Age of Onset  . Breast cancer Maternal Aunt   . Breast cancer Maternal Grandmother     Social History   Social History  . Marital status: Married    Spouse name: N/A  . Number of children: N/A  . Years of education: N/A   Occupational History  . Not on file.   Social History Main Topics  . Smoking status: Current Every Day Smoker    Packs/day: 1.00    Types: Cigarettes  . Smokeless tobacco: Never Used  . Alcohol use No  . Drug use: No  . Sexual activity: Yes   Other Topics Concern  . Not on file   Social History Narrative  . No narrative on file    Allergies  Allergen Reactions  . Other Other (See Comments)    Tomatoes, dust and mold. 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 Sulfa Antibiotics Other (See Comments)  . Tomato     Outpatient Medications Prior to Visit  Medication Sig Dispense Refill  . amoxicillin-clavulanate (AUGMENTIN) 875-125 MG tablet Take 1  tablet by mouth every 12 (twelve) hours. 20 tablet 0   No facility-administered medications prior to visit.     Review of Systems  Constitutional: Negative for chills, fever, malaise/fatigue and weight loss.  HENT: Negative for ear discharge, ear pain and sore throat.   Eyes: Negative for blurred vision.  Respiratory: Negative for cough, sputum production, shortness of breath and wheezing.   Cardiovascular: Negative for chest pain, palpitations and leg swelling.  Gastrointestinal: Negative for abdominal pain, blood in stool, constipation, diarrhea, heartburn, melena and nausea.  Genitourinary: Negative for dysuria, frequency, hematuria and urgency.  Musculoskeletal: Negative for back pain, joint pain, myalgias and neck pain.  Skin: Negative for rash.  Neurological: Negative for dizziness, tingling, sensory change, focal weakness and headaches.  Endo/Heme/Allergies: Negative for environmental allergies and polydipsia. Does not bruise/bleed easily.  Psychiatric/Behavioral: Negative for depression and suicidal ideas. The patient is not nervous/anxious and does not have insomnia.      Objective  Vitals:   06/05/16 0826  BP: 120/80  Pulse: 80  Weight: 263 lb (119.3 kg)  Height: 5\' 6"  (1.676 m)    Physical Exam  Constitutional: She is oriented to person, place, and time and well-developed, well-nourished, and in no distress. No distress.  HENT:  Head: Normocephalic and atraumatic.  Right Ear: External ear normal.  Left Ear: External ear normal.  Nose: Nose normal.  Mouth/Throat: Oropharynx is clear and moist.  Eyes: Conjunctivae and EOM are normal. Pupils are equal, round, and reactive to light. Right eye exhibits no discharge. Left eye exhibits no discharge.  Neck: Normal range of motion. Neck supple. No JVD present. No thyromegaly present.  Cardiovascular: Normal rate, regular rhythm, normal heart sounds and intact distal pulses.  Exam reveals no gallop and no friction rub.   No  murmur heard. Pulmonary/Chest: Effort normal and breath sounds normal. No respiratory distress. She has no wheezes. She has no rales.  Abdominal: Soft. Bowel sounds are normal. She exhibits no distension and no mass. There is no tenderness. There is no rebound and no guarding.  Genitourinary: Vagina normal, right adnexa normal, left adnexa normal and vulva normal. Rectal exam shows guaiac negative stool.  Musculoskeletal: Normal range of motion. She exhibits no edema, tenderness or deformity.  Lymphadenopathy:    She has no cervical adenopathy.  Neurological: She is alert and oriented to person, place, and time. She has normal reflexes. No cranial nerve deficit. Coordination normal.  Skin: Skin is warm and dry. No rash noted. She is not diaphoretic. No pallor.  Psychiatric: Mood and affect normal.  Nursing note and vitals reviewed.     Assessment & Plan  Problem List Items Addressed This Visit      Other   Extreme obesity ([place])   Relevant Orders   Renal Function Panel   Lipid Profile    Other Visit Diagnoses    Annual physical exam    -  Primary   Relevant Orders   Renal Function Panel   POCT urinalysis dipstick (Completed)   Pap IG (Image Guided)   Impingement syndrome of left shoulder region       Relevant Orders   Ambulatory referral to Orthopedic Surgery   Colon cancer screening       Relevant Orders   POCT occult blood stool (Completed)   Abnormal chest x-ray       Relevant Orders   DG Chest 2 View   Screening for vaginal cancer       Relevant Orders   Pap IG (Image Guided)   Breast cancer screening       Relevant Orders   MM Digital Screening      No orders of the defined types were placed in this encounter.     Dr. [facility]  06/05/16

## 2016-06-06 LAB — RENAL FUNCTION PANEL
Albumin: 4.2 g/dL (ref 3.5–5.5)
BUN / CREAT RATIO: 15 (ref 9–23)
BUN: 11 mg/dL (ref 6–24)
CHLORIDE: 95 mmol/L — AB (ref 96–106)
CO2: 29 mmol/L (ref 18–29)
Calcium: 9.9 mg/dL (ref 8.7–10.2)
Creatinine, Ser: 0.75 mg/dL (ref 0.57–1.00)
GFR calc Af Amer: 109 mL/min/{1.73_m2} (ref >59)
GFR, EST NON AFRICAN AMERICAN: 95 mL/min/{1.73_m2} (ref >59)
Glucose: 157 mg/dL — ABNORMAL HIGH (ref 65–99)
PHOSPHORUS: 3.6 mg/dL (ref 2.5–4.5)
Potassium: 4.1 mmol/L (ref 3.5–5.2)
SODIUM: 140 mmol/L (ref 134–144)

## 2016-06-06 LAB — LIPID PANEL
CHOLESTEROL TOTAL: 257 mg/dL — AB (ref 100–199)
Chol/HDL Ratio: 7.6 ratio — ABNORMAL HIGH (ref 0.0–4.4)
HDL: 34 mg/dL — ABNORMAL LOW (ref >39)
LDL Calculated: 196 mg/dL — ABNORMAL HIGH (ref 0–99)
TRIGLYCERIDES: 135 mg/dL (ref 0–149)
VLDL Cholesterol Cal: 27 mg/dL (ref 5–40)

## 2016-06-07 LAB — PAP IG (IMAGE GUIDED): PAP Smear Comment: 0

## 2016-06-18 ENCOUNTER — Other Ambulatory Visit: Payer: Self-pay | Admitting: Family Medicine

## 2016-06-18 MED ORDER — OMEGA 3 1200 MG PO CAPS: 1.0000 | ORAL_CAPSULE | Freq: Two times a day (BID) | ORAL | 1 refills | Status: DC

## 2016-07-07 ENCOUNTER — Ambulatory Visit
Admission: RE | Admit: 2016-07-07 | Discharge: 2016-07-07 | Disposition: A | Discharge status: Home / Self Care | Payer: 59 | Source: Ambulatory Visit | Attending: Family Medicine | Admitting: Family Medicine

## 2016-07-07 DIAGNOSIS — Z1231 Encounter for screening mammogram for malignant neoplasm of breast: Secondary | ICD-10-CM | POA: Diagnosis not present

## 2016-07-07 DIAGNOSIS — Z1239 Encounter for other screening for malignant neoplasm of breast: Secondary | ICD-10-CM

## 2017-03-11 ENCOUNTER — Encounter: Payer: Self-pay | Admitting: Emergency Medicine

## 2017-03-11 ENCOUNTER — Ambulatory Visit (INDEPENDENT_AMBULATORY_CARE_PROVIDER_SITE_OTHER): Payer: Managed Care, Other (non HMO)

## 2017-03-11 ENCOUNTER — Other Ambulatory Visit: Payer: Self-pay

## 2017-03-11 ENCOUNTER — Ambulatory Visit
Admission: EM | Admit: 2017-03-11 | Discharge: 2017-03-11 | Disposition: A | Discharge status: Home / Self Care | Payer: Managed Care, Other (non HMO) | Attending: Emergency Medicine | Admitting: Emergency Medicine

## 2017-03-11 DIAGNOSIS — J441 Chronic obstructive pulmonary disease with (acute) exacerbation: Secondary | ICD-10-CM | POA: Diagnosis not present

## 2017-03-11 DIAGNOSIS — R05 Cough: Secondary | ICD-10-CM | POA: Diagnosis not present

## 2017-03-11 MED ORDER — AEROCHAMBER PLUS MISC: 2 refills | Status: DC

## 2017-03-11 MED ORDER — ALBUTEROL SULFATE HFA 108 (90 BASE) MCG/ACT IN AERS: 1.0000 | INHALATION_SPRAY | Freq: Four times a day (QID) | RESPIRATORY_TRACT | 0 refills | Status: DC | PRN

## 2017-03-11 MED ORDER — IPRATROPIUM-ALBUTEROL 0.5-2.5 (3) MG/3ML IN SOLN
3.0000 mL | Freq: Once | RESPIRATORY_TRACT | Status: AC
  Administered 2017-03-11: 3 mL via RESPIRATORY_TRACT

## 2017-03-11 MED ORDER — AZITHROMYCIN 250 MG PO TABS: 250.0000 mg | ORAL_TABLET | Freq: Every day | ORAL | 0 refills | Status: DC

## 2017-03-11 MED ORDER — PREDNISONE 20 MG PO TABS: 40.0000 mg | ORAL_TABLET | Freq: Every day | ORAL | 0 refills | Status: AC

## 2017-03-11 MED ORDER — HYDROCODONE-HOMATROPINE 5-1.5 MG/5ML PO SYRP
5.0000 mL | ORAL_SOLUTION | Freq: Four times a day (QID) | ORAL | 0 refills | Status: DC | PRN
Start: 2017-03-11 — End: 2017-06-11

## 2017-03-11 MED ORDER — BENZONATATE 200 MG PO CAPS: 200.0000 mg | ORAL_CAPSULE | Freq: Three times a day (TID) | ORAL | 0 refills | Status: DC | PRN

## 2017-03-11 MED ORDER — PREDNISONE 50 MG PO TABS
60.0000 mg | ORAL_TABLET | Freq: Once | ORAL | Status: AC
  Administered 2017-03-11: 60 mg via ORAL

## 2017-03-11 NOTE — ED Triage Notes (Signed)
Patient c/o cough and chest congestion for 2 weeks.  Patient states that she had Gastric Sleeve surgery done on 12/17.  Patient denies fevers.

## 2017-03-11 NOTE — Discharge Instructions (Signed)
Here is a list of primary care providers who are taking new patients: ° °Dr. Deanna Jones, Dr. William Plonk °[address] °[address] °[place] [address] °[phone] ° °[facility] [place] °[address]  °[place] [address]  °[phone] ° °[facility] °[address]  °[place], [place] [address] °[phone] ° °[facility] [place] °[address]  °[phone] °[place], [place] [address] ° °Here are clinics/ other resources who will see you if you do not have insurance. Some have certain criteria that you must meet. Call them and find out what they are: ° °[facility]: °[address] [place] [address] [place], [place] [address] °Phone: [phone] °Hours: First and Third Saturdays of each Month, 9 a.m. - 1 p.m. ° °[facility]: °[address], [place], [place] [address] °Phone: [phone] °Hours: °Tuesday, 4 p.m. - 8 p.m. °Thursday, 1 p.m. - 8 p.m. °Wednesday, 9 a.m. - Noon ° °[facility] °[address], [place], [place] [address] °Phone: [phone] °Pharmacy Phone Number: [phone] °Dental Phone Number: [phone] °[employer] Insurance Help: [phone] ° °Dental Hours: °Monday - Thursday, 8 a.m. - 6 p.m. ° °[facility] °[address] [place], [place] [address] °Phone: [phone] °Pharmacy Phone Number: [phone] °ACA Insurance Help: [phone] ° °[facility] °[address] [place], [place] [address] °Phone: [phone] °Pharmacy Phone Number: [phone] °ACA Insurance Help: [phone] ° °[facility] °[address] [place], [place] [address] °Phone: [phone] °ACA Insurance Help: [phone]  ° °[facility] °[address] , [place] [address] °Phone: [phone] ° °Go to www.goodrx.com to look up your medications. This will give you a list of where you can find your prescriptions at the most affordable prices. Or ask the pharmacist what the cash price is,  or if they have any other discount programs available to help make your medication more affordable. This can be less expensive than what you would pay with insurance.   °

## 2017-03-11 NOTE — ED Provider Notes (Signed)
HPI  SUBJECTIVE:  Sarah Pham is a 52 y.o. female who presents with a cough productive of thick white mucus, increased amount for the past 2 weeks.  She reports nasal congestion, rhinorrhea, postnasal drip, but states that she does not feel bad.  Reports chest soreness secondary to the cough and states that she has been not able to sleep at night secondary to the cough.  She states her grandkids were sick with similar symptoms 2 weeks ago.  No sinus pain or pressure, fevers, wheezing, chest pain, shortness of breath, dyspnea on exertion.  No posttussive emesis.  No GERD symptoms.  She tried Delsym, Robitussin, Tylenol without improvement in her symptoms.  No alleviating factors.  Symptoms are worse with exposure to pulmonary irritants.  She was on an unknown antibiotic, prednisone, and albuterol inhaler in November for bronchitis.  She states that she does not feel as bad as she did back in November.  No antipyretic in the past 6-8 hours.  She has a past medical history of COPD, she has a 23-year pack history of smoking, quit 2 years ago.  She has a past medical history of obesity status post gastric sleeve.  No history of asthma, emphysema, GERD, diabetes, hypertension, CHF, PE, DVT.  LMP: Status post hysterectomy.  PMD: None.    Past Medical History:  Diagnosis Date  . Hyperlipidemia   . Obesity     Past Surgical History:  Procedure Laterality Date  . FOOT SURGERY    . LAPAROSCOPIC GASTRIC SLEEVE RESECTION    . NASAL SINUS SURGERY    . VAGINAL HYSTERECTOMY  1995    Family History  Problem Relation Age of Onset  . Breast cancer Maternal Aunt   . Breast cancer Maternal Grandmother     Social History   Tobacco Use  . Smoking status: Former Smoker    Packs/day: 1.00    Types: Cigarettes  . Smokeless tobacco: Never Used  Substance Use Topics  . Alcohol use: No    Alcohol/week: 0.0 oz  . Drug use: No    No current facility-administered medications for this encounter.   Current  Outpatient Medications:  .  cetirizine (ZYRTEC) 10 MG tablet, Take 10 mg by mouth daily., Disp: , Rfl:  .  albuterol (PROVENTIL HFA;VENTOLIN HFA) 108 (90 Base) MCG/ACT inhaler, Inhale 1-2 puffs into the lungs every 6 (six) hours as needed for wheezing or shortness of breath., Disp: 1 Inhaler, Rfl: 0 .  azithromycin (ZITHROMAX) 250 MG tablet, Take 1 tablet (250 mg total) by mouth daily. 2 tabs po on day 1, 1 tab po on days 2-5, Disp: 6 tablet, Rfl: 0 .  benzonatate (TESSALON) 200 MG capsule, Take 1 capsule (200 mg total) by mouth 3 (three) times daily as needed for cough., Disp: 30 capsule, Rfl: 0 .  HYDROcodone-homatropine (HYCODAN) 5-1.5 MG/5ML syrup, Take 5 mLs by mouth every 6 (six) hours as needed for cough., Disp: 120 mL, Rfl: 0 .  predniSONE (DELTASONE) 20 MG tablet, Take 2 tablets (40 mg total) by mouth daily with breakfast for 4 days., Disp: 8 tablet, Rfl: 0 .  Spacer/Aero-Holding Chambers (AEROCHAMBER PLUS) inhaler, Use as instructed, Disp: 1 each, Rfl: 2  Allergies  Allergen Reactions  . Other Other (See Comments)    Tomatoes, dust and mold. 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 Sulfa Antibiotics Other (See Comments)  . Tomato      ROS  As noted in HPI.   Physical Exam  BP 124/79 (BP Location: Left Arm)   Pulse 76   Temp 98.3 F (36.8 C) (Oral)   Resp 16   Ht 5\' 5"  (1.651 m)   Wt 235 lb (106.6 kg)   SpO2 94%   BMI 39.11 kg/m   Constitutional: Well developed, well nourished, no acute distress.  Dry cough. Eyes:  EOMI, conjunctiva normal bilaterally HENT: Normocephalic, atraumatic,mucus membranes moist.  Turbinates.  No sinus tenderness.  No nasal congestion.  No obvious postnasal drip, cobblestoning. Respiratory: Normal inspiratory effort, lungs clear bilaterally, good air movement. Cardiovascular: Normal rate gallops  GI: nondistended skin: No rash, skin intact Musculoskeletal: no deformities calves symmetric, nontender, no edema Neurologic:  Alert & oriented x 3, no focal neuro deficits Psychiatric: Speech and behavior appropriate   ED Course   Medications  ipratropium-albuterol (DUONEB) 0.5-2.5 (3) MG/3ML nebulizer solution 3 mL (3 mLs Nebulization Given 03/11/17 0933)  predniSONE (DELTASONE) tablet 60 mg (60 mg Oral Given 03/11/17 0944)    Orders Placed This Encounter  Procedures  . DG Chest 2 View    Standing Status:   Standing    Number of Occurrences:   1    Order Specific Question:   Reason for Exam (SYMPTOM  OR DIAGNOSIS REQUIRED)    Answer:   cough r./o pna, pulm edema, effusion  . Recheck vitals    O2 sat post neb tx    Standing Status:   Standing    Number of Occurrences:   1    No results found for this or any previous visit (from the past 24 hour(s)). Dg Chest 2 View  Result Date: 03/11/2017 CLINICAL DATA:  Dry cough over the last several weeks. EXAM: CHEST  2 VIEW COMPARISON:  06/05/2016 FINDINGS: Heart size is normal. Mediastinal shadows are normal. There is mild central bronchial thickening but no infiltrate, collapse or effusion. No bone abnormality. IMPRESSION: Mild bronchitis pattern.  No consolidation or collapse. Electronically Signed   By: Nelson Chimes M.D.   On: 03/11/2017 09:40    ED Clinical Impression  COPD exacerbation [facility])   ED Assessment/Plan  [place] Narcotic database reviewed for this patient, and feel that the risk/benefit ratio today is favorable for proceeding with a prescription for controlled substance.  Last opiate prescription was for 300 mL Percocet 7.5/325 on 12/19   previous O2 sats between 95-98%.  93% today.  Otherwise vitals normal, afebrile.  Will check chest x-ray because of this, also give a DuoNeb and 60 mg of prednisone.  suspect COPD exacerbation.  Doubt PE, CHF.  Will reevaluate.  Will treat as a COPD exacerbation but she only has 1 cardinal symptoms of increased sputum production.  Denies sputum purulence.  Plan to send home with an albuterol inhaler with a spacer.   Patient states that she no longer has her albuterol inhaler at home.  Also 4 days of 40 mg of prednisone patient is to start this tomorrow, hycodan, Tessalon, advised flonase wait-and-see prescription of azithromycin not getting better with regular bronchodilators and steroids.   reviewed imaging independently.  Mild bronchitis pattern.  No infiltrate, collapse, effusion see radiology report for full details.  Reevaluation post treatment, 94% saturation.  Patient states she feels better.  Lungs clear bilaterally.  Plan as above.  Will order primary care referral and provide primary care referral list.  Discussed  imaging, MDM, plan and followup with patient. Discussed sn/sx that should prompt return to the ED. patient agrees with plan.   Meds ordered this encounter  Medications  . ipratropium-albuterol (DUONEB) 0.5-2.5 (3) MG/3ML nebulizer solution 3 mL  . predniSONE (DELTASONE) tablet 60 mg  . albuterol (PROVENTIL HFA;VENTOLIN HFA) 108 (90 Base) MCG/ACT inhaler    Sig: Inhale 1-2 puffs into the lungs every 6 (six) hours as needed for wheezing or shortness of breath.    Dispense:  1 Inhaler    Refill:  0  . predniSONE (DELTASONE) 20 MG tablet    Sig: Take 2 tablets (40 mg total) by mouth daily with breakfast for 4 days.    Dispense:  8 tablet    Refill:  0  . benzonatate (TESSALON) 200 MG capsule    Sig: Take 1 capsule (200 mg total) by mouth 3 (three) times daily as needed for cough.    Dispense:  30 capsule    Refill:  0  . Spacer/Aero-Holding Chambers (AEROCHAMBER PLUS) inhaler    Sig: Use as instructed    Dispense:  1 each    Refill:  2  . HYDROcodone-homatropine (HYCODAN) 5-1.5 MG/5ML syrup    Sig: Take 5 mLs by mouth every 6 (six) hours as needed for cough.    Dispense:  120 mL    Refill:  0  . azithromycin (ZITHROMAX) 250 MG tablet    Sig: Take 1 tablet (250 mg total) by mouth daily. 2 tabs po on day 1, 1 tab po on days 2-5    Dispense:  6 tablet    Refill:  0    *This  clinic note was created using [employer]. Therefore, there may be occasional mistakes despite careful proofreading.   ?    Melynda Ripple, MD 03/11/17 [phone]

## 2017-03-14 ENCOUNTER — Telehealth: Payer: Self-pay

## 2017-03-14 NOTE — Telephone Encounter (Signed)
Called to follow up with patient since visit here at [facility]. Patient will call back with any questions or concerns. [facility]

## 2017-05-28 ENCOUNTER — Ambulatory Visit: Payer: Self-pay | Admitting: Physician Assistant

## 2017-06-11 ENCOUNTER — Ambulatory Visit: Payer: Managed Care, Other (non HMO) | Admitting: Physician Assistant

## 2017-06-11 ENCOUNTER — Ambulatory Visit (INDEPENDENT_AMBULATORY_CARE_PROVIDER_SITE_OTHER): Payer: Managed Care, Other (non HMO) | Admitting: Physician Assistant

## 2017-06-11 ENCOUNTER — Encounter: Payer: Self-pay | Admitting: Physician Assistant

## 2017-06-11 VITALS — BP 110/72 | HR 82 | Temp 97.8°F | Resp 16 | Ht 65.5 in | Wt 202.0 lb

## 2017-06-11 DIAGNOSIS — Z9989 Dependence on other enabling machines and devices: Secondary | ICD-10-CM | POA: Diagnosis not present

## 2017-06-11 DIAGNOSIS — Z9884 Bariatric surgery status: Secondary | ICD-10-CM

## 2017-06-11 DIAGNOSIS — E8809 Other disorders of plasma-protein metabolism, not elsewhere classified: Secondary | ICD-10-CM

## 2017-06-11 DIAGNOSIS — E7849 Other hyperlipidemia: Secondary | ICD-10-CM | POA: Diagnosis not present

## 2017-06-11 DIAGNOSIS — G4733 Obstructive sleep apnea (adult) (pediatric): Secondary | ICD-10-CM

## 2017-06-11 DIAGNOSIS — E756 Lipid storage disorder, unspecified: Secondary | ICD-10-CM

## 2017-06-11 DIAGNOSIS — Z6833 Body mass index (BMI) 33.0-33.9, adult: Secondary | ICD-10-CM | POA: Diagnosis not present

## 2017-06-11 DIAGNOSIS — S0121XA Laceration without foreign body of nose, initial encounter: Secondary | ICD-10-CM

## 2017-06-11 MED ORDER — MUPIROCIN CALCIUM 2 % NA OINT
1.0000 | TOPICAL_OINTMENT | Freq: Two times a day (BID) | NASAL | 0 refills | Status: DC
Start: 2017-06-11 — End: 2018-07-22

## 2017-06-11 MED ORDER — EPINEPHRINE 0.3 MG/0.3ML IJ SOAJ: 0.3000 mg | Freq: Once | INTRAMUSCULAR | 3 refills | Status: AC

## 2017-06-11 NOTE — Progress Notes (Signed)
Patient: Sarah Pham, Female    DOB: [birth year], 52 y.o.   MRN: [id number] Visit Date: 06/11/2017  Today's Provider: Mar Daring, PA-C   No chief complaint on file.  Subjective:   Patient comes in today to establish care. She was previously a patient at [facility], Dr. Ronnald Ramp.  She reports overall she is doing very well at the time. She underwent gastric sleeve surgery in December 2018 with Dr. Darnell Level in [place], [place] and has lost 72 pounds. She is still following up with her bariatric surgeon, Dr. Darnell Level, and will be seeing them in July 2019. She does continue to exercise daily with walking up to 5 miles per day and does a HIIT class at least once weekly, sometimes twice. She also continues to keep control over her dieting and portion control.   She also has familial hyperlipidemia. She was previously on a statin for cholesterol control but had to be taken off because her liver enzymes started to elevate. This was all prior to her bariatric surgery. She has not had any labs checked since her surgery.   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 She carries an epi-pen with her.   She does also have sleep apnea. She has had this for a while. She reports having an auto-pap machine with nasal mask. She uses this every night. Due to use she has developed a small sore in the right nostril medial side. She has not done anything for treatment of this.   She does work for [employer]. She is married. Her husband is an Nurse, children's. They have one daughter together and 2 grandchildren a girl, 70, and a boy, 2.     Review of Systems  Constitutional: Negative.   HENT: Negative.   Eyes: Negative.   Respiratory: Negative.   Cardiovascular: Negative.   Gastrointestinal: Negative.   Endocrine: Negative.   Genitourinary: Negative.   Musculoskeletal: Positive for arthralgias.  Skin: Negative.     Allergic/Immunologic: Negative.   Neurological: Negative.   Hematological: Negative.   Psychiatric/Behavioral: Negative.     Social History      She  reports that she has quit smoking. Her smoking use included cigarettes. She smoked 1.00 pack per day. She has never used smokeless tobacco. She reports that she does not drink alcohol or use drugs.       Social History   Socioeconomic History  . Marital status: Married    Spouse name: Not on file  . Number of children: Not on file  . Years of education: Not on file  . Highest education level: Not on file  Occupational History  . Not on file  Social Needs  . Financial resource strain: Not on file  . Food insecurity:    Worry: Not on file    Inability: Not on file  . Transportation needs:    Medical: Not on file    Non-medical: Not on file  Tobacco Use  . Smoking status: Former Smoker    Packs/day: 1.00    Types: Cigarettes  . Smokeless tobacco: Never Used  Substance and Sexual Activity  . Alcohol use: No    Alcohol/week: 0.0 oz  . Drug use: No  . Sexual activity: Yes  Lifestyle  . Physical activity:    Days per week: Not on file    Minutes per session: Not on file  . Stress: Not on  file  Relationships  . Social connections:    Talks on phone: Not on file    Gets together: Not on file    Attends religious service: Not on file    Active member of club or organization: Not on file    Attends meetings of clubs or organizations: Not on file    Relationship status: Not on file  Other Topics Concern  . Not on file  Social History Narrative  . Not on file    Past Medical History:  Diagnosis Date  . Hyperlipidemia   . Obesity      Patient Active Problem List   Diagnosis Date Noted  . Viral warts 07/06/2014  . 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 Elevated blood pressure, situational 07/06/2014  . Routine general medical examination at a health care facility 07/06/2014  . Carpal  tunnel syndrome 07/06/2014  . Chronic obstructive pulmonary disease with acute lower resp infection ([place]) 07/06/2014  . Compulsive tobacco user syndrome 07/06/2014  . Extreme obesity 07/06/2014    Past Surgical History:  Procedure Laterality Date  . FOOT SURGERY    . LAPAROSCOPIC GASTRIC SLEEVE RESECTION    . NASAL SINUS SURGERY    . VAGINAL HYSTERECTOMY  1995    Family History        Family Status  Relation Name Status  . Mother  Alive  . Father  Alive  . Mat Aunt  (Not Specified)  . MGM  (Not Specified)        Her family history includes Breast cancer in her maternal aunt and maternal grandmother.      Allergies  Allergen Reactions  . Other Other (See Comments)    Tomatoes, dust and mold. 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 Sulfa Antibiotics Other (See Comments)  . Tomato      Current Outpatient Medications:  .  cetirizine (ZYRTEC) 10 MG tablet, Take 10 mg by mouth daily., Disp: , Rfl:  .  albuterol (PROVENTIL HFA;VENTOLIN HFA) 108 (90 Base) MCG/ACT inhaler, Inhale 1-2 puffs into the lungs every 6 (six) hours as needed for wheezing or shortness of breath. (Patient not taking: Reported on 06/11/2017), Disp: 1 Inhaler, Rfl: 0 .  azithromycin (ZITHROMAX) 250 MG tablet, Take 1 tablet (250 mg total) by mouth daily. 2 tabs po on day 1, 1 tab po on days 2-5 (Patient not taking: Reported on 06/11/2017), Disp: 6 tablet, Rfl: 0 .  benzonatate (TESSALON) 200 MG capsule, Take 1 capsule (200 mg total) by mouth 3 (three) times daily as needed for cough. (Patient not taking: Reported on 06/11/2017), Disp: 30 capsule, Rfl: 0 .  HYDROcodone-homatropine (HYCODAN) 5-1.5 MG/5ML syrup, Take 5 mLs by mouth every 6 (six) hours as needed for cough. (Patient not taking: Reported on 06/11/2017), Disp: 120 mL, Rfl: 0 .  Spacer/Aero-Holding Chambers (AEROCHAMBER PLUS) inhaler, Use as instructed (Patient not taking: Reported on 06/11/2017), Disp: 1 each, Rfl: 2   Patient Care  Team: Rubye Beach as PCP - General (Family Medicine)      Objective:   Vitals: BP 110/72 (BP Location: Left Arm, Patient Position: Sitting, Cuff Size: Normal)   Pulse 82   Temp 97.8 F (36.6 C)   Resp 16   Ht 5' 5.5" (1.664 m)   Wt 202 lb (91.6 kg)   SpO2 96%   BMI 33.10 kg/m    Vitals:   06/11/17 1105  BP: 110/72  Pulse: 82  Resp: 16  Temp:  97.8 F (36.6 C)  SpO2: 96%  Weight: 202 lb (91.6 kg)  Height: 5' 5.5" (1.664 m)     Physical Exam  Constitutional: She appears well-developed and well-nourished. No distress.  HENT:  Head: Normocephalic and atraumatic.  Right Ear: Hearing, tympanic membrane, external ear and ear canal normal.  Left Ear: Hearing, tympanic membrane, external ear and ear canal normal.  Nose: Nose lacerations present.    Mouth/Throat: Uvula is midline, oropharynx is clear and moist and mucous membranes are normal. No oropharyngeal exudate.  Eyes: Pupils are equal, round, and reactive to light. Conjunctivae and EOM are normal. Right eye exhibits no discharge. Left eye exhibits no discharge.  Neck: Normal range of motion. Neck supple. No JVD present. No tracheal deviation present. No Brudzinski's sign and no Kernig's sign noted. No thyromegaly present.  Cardiovascular: Normal rate, regular rhythm and normal heart sounds. Exam reveals no gallop and no friction rub.  No murmur heard. Pulmonary/Chest: Effort normal and breath sounds normal. No stridor. No respiratory distress. She has no wheezes. She has no rales. She exhibits no tenderness.  Lymphadenopathy:    She has no cervical adenopathy.  Skin: Skin is warm and dry. She is not diaphoretic.  Vitals reviewed.    Depression Screen No flowsheet data found.    Assessment & Plan:     Routine Health Maintenance and Physical Exam  Exercise Activities and Dietary recommendations Goals    None      Immunization History  Administered Date(s) Administered  . Td 02/23/2005     Health Maintenance  Topic Date Due  . HIV Screening  02/20/1983  . TETANUS/TDAP  02/24/2015  . INFLUENZA VACCINE  09/23/2017  . PAP SMEAR  06/06/2019     Discussed health benefits of physical activity, and encouraged her to engage in regular exercise appropriate for her age and condition.    1. Establishing care with new doctor, encounter for Establishing care from [facility], Dr. Ronnald Ramp.   2. Laceration of nose without foreign body, initial encounter New lesion noted. Will give bactroban as below. Use as directed. Call if no improvements.  - mupirocin nasal ointment (BACTROBAN NASAL) 2 %; Place 1 application into the nose 2 (two) times daily.  Dispense: 10 g; Refill: 0 - CBC w/Diff/Platelet - Comprehensive Metabolic Panel (CMET) - TSH - Lipid Profile  3. Alpha galactosidase deficiency Diagnosed in 2014. Suppose to have labs checked every 5 years per Dr. Kathyrn Sheriff. Will check labs as below. Epipen refilled.  - EPINEPHrine 0.3 mg/0.3 mL IJ SOAJ injection; Inject 0.3 mLs (0.3 mg total) into the muscle once for 1 dose.  Dispense: 2 Device; Refill: 3 - CBC w/Diff/Platelet - Comprehensive Metabolic Panel (CMET) - TSH - Lipid Profile - Alpha-Gal Panel  4. Familial multiple lipoprotein-type hyperlipidemia H/O elevated cholesterol and elevated LFTs. No labs since losing 72 pounds with gastric sleeve. Will check labs as below and f/u pending lab results.  - CBC w/Diff/Platelet - Comprehensive Metabolic Panel (CMET) - TSH - Lipid Profile  5. Status post bariatric surgery Gastric sleeve in Dec 2018 with Dr. Darnell Level in [place], [place]. Follows up in July 2019.  - CBC w/Diff/Platelet - Comprehensive Metabolic Panel (CMET) - TSH - Lipid Profile - HgB A1c  6. OSA on CPAP Uses nightly. Doing well.  - CBC w/Diff/Platelet - Comprehensive Metabolic Panel (CMET) - TSH - Lipid Profile  7. BMI 33.0-33.9,adult Continues to do well post gastric sleeve. Has lost 72 pounds. Continues to  exercise and portion control.  -  CBC w/Diff/Platelet - Comprehensive Metabolic Panel (CMET) - TSH - Lipid Profile - HgB A1c   Mar Daring, PA-C  [facility]

## 2017-06-12 LAB — HEMOGLOBIN A1C
ESTIMATED AVERAGE GLUCOSE: 114 mg/dL
HEMOGLOBIN A1C: 5.6 % (ref 4.8–5.6)

## 2017-06-16 LAB — COMPREHENSIVE METABOLIC PANEL
ALK PHOS: 83 IU/L (ref 39–117)
ALT: 26 IU/L (ref 0–32)
AST: 24 IU/L (ref 0–40)
Albumin/Globulin Ratio: 1.6 (ref 1.2–2.2)
Albumin: 4.6 g/dL (ref 3.5–5.5)
BILIRUBIN TOTAL: 0.5 mg/dL (ref 0.0–1.2)
BUN/Creatinine Ratio: 21 (ref 9–23)
BUN: 13 mg/dL (ref 6–24)
CHLORIDE: 103 mmol/L (ref 96–106)
CO2: 26 mmol/L (ref 20–29)
CREATININE: 0.61 mg/dL (ref 0.57–1.00)
Calcium: 10.5 mg/dL — ABNORMAL HIGH (ref 8.7–10.2)
GFR calc Af Amer: 123 mL/min/{1.73_m2} (ref >59)
GFR calc non Af Amer: 107 mL/min/{1.73_m2} (ref >59)
GLUCOSE: 69 mg/dL (ref 65–99)
Globulin, Total: 2.8 g/dL (ref 1.5–4.5)
Potassium: 4.3 mmol/L (ref 3.5–5.2)
Sodium: 144 mmol/L (ref 134–144)
Total Protein: 7.4 g/dL (ref 6.0–8.5)

## 2017-06-16 LAB — LIPID PANEL
CHOLESTEROL TOTAL: 223 mg/dL — AB (ref 100–199)
Chol/HDL Ratio: 6.2 ratio — ABNORMAL HIGH (ref 0.0–4.4)
HDL: 36 mg/dL — AB (ref >39)
LDL Calculated: 165 mg/dL — ABNORMAL HIGH (ref 0–99)
Triglycerides: 109 mg/dL (ref 0–149)
VLDL CHOLESTEROL CAL: 22 mg/dL (ref 5–40)

## 2017-06-16 LAB — ALPHA-GAL PANEL
Alpha Gal IgE*: 5.21 kU/L — ABNORMAL HIGH (ref <0.10)
BEEF (BOS SPP) IGE: 2.32 kU/L — AB (ref <0.35)
Class Interpretation: 2
LAMB CLASS INTERPRETATION: 2
Lamb/Mutton (Ovis spp) IgE: 0.79 kU/L — ABNORMAL HIGH (ref <0.35)
PORK (SUS SPP) IGE: 1.4 kU/L — AB (ref <0.35)
PORK CLASS INTERPRETATION: 2

## 2017-06-16 LAB — TSH: TSH: 0.848 u[IU]/mL (ref 0.450–4.500)

## 2017-06-16 LAB — CBC WITH DIFFERENTIAL/PLATELET
BASOS ABS: 0 10*3/uL (ref 0.0–0.2)
Basos: 0 %
EOS (ABSOLUTE): 0.2 10*3/uL (ref 0.0–0.4)
Eos: 2 %
HEMOGLOBIN: 17 g/dL — AB (ref 11.1–15.9)
Hematocrit: 48.2 % — ABNORMAL HIGH (ref 34.0–46.6)
Immature Grans (Abs): 0 10*3/uL (ref 0.0–0.1)
Immature Granulocytes: 0 %
LYMPHS ABS: 4.4 10*3/uL — AB (ref 0.7–3.1)
Lymphs: 40 %
MCH: 29.7 pg (ref 26.6–33.0)
MCHC: 35.3 g/dL (ref 31.5–35.7)
MCV: 84 fL (ref 79–97)
MONOCYTES: 7 %
Monocytes Absolute: 0.7 10*3/uL (ref 0.1–0.9)
NEUTROS ABS: 5.5 10*3/uL (ref 1.4–7.0)
Neutrophils: 51 %
PLATELETS: 274 10*3/uL (ref 150–379)
RBC: 5.73 x10E6/uL — AB (ref 3.77–5.28)
RDW: 14.9 % (ref 12.3–15.4)
WBC: 10.9 10*3/uL — AB (ref 3.4–10.8)

## 2017-07-15 ENCOUNTER — Ambulatory Visit (INDEPENDENT_AMBULATORY_CARE_PROVIDER_SITE_OTHER): Payer: Managed Care, Other (non HMO) | Admitting: Physician Assistant

## 2017-07-15 ENCOUNTER — Encounter: Payer: Self-pay | Admitting: Physician Assistant

## 2017-07-15 VITALS — BP 112/80 | HR 73 | Temp 98.3°F | Resp 16 | Ht 65.0 in | Wt 192.0 lb

## 2017-07-15 DIAGNOSIS — Z1231 Encounter for screening mammogram for malignant neoplasm of breast: Secondary | ICD-10-CM | POA: Diagnosis not present

## 2017-07-15 DIAGNOSIS — D72829 Elevated white blood cell count, unspecified: Secondary | ICD-10-CM | POA: Diagnosis not present

## 2017-07-15 DIAGNOSIS — Z0000 Encounter for general adult medical examination without abnormal findings: Secondary | ICD-10-CM

## 2017-07-15 DIAGNOSIS — Z1211 Encounter for screening for malignant neoplasm of colon: Secondary | ICD-10-CM

## 2017-07-15 DIAGNOSIS — Z1239 Encounter for other screening for malignant neoplasm of breast: Secondary | ICD-10-CM

## 2017-07-15 NOTE — Patient Instructions (Signed)

## 2017-07-15 NOTE — Progress Notes (Signed)
Patient: Sarah Pham, Female    DOB: 09-11-1965, 52 y.o.   MRN: [id number] Visit Date: 07/15/2017  Today's Provider: Mar Daring, PA-C   Chief Complaint  Patient presents with  . Annual Exam   Subjective:    Annual physical exam Sarah Pham is a 52 y.o. female who presents today for health maintenance and complete physical. She feels well. She reports exercising everyday, walking 6-8 miles a day. She reports she is sleeping well on CPAP. Patient reports that she does monthly breast checks, she is due for mammogram. Patient states that yesterday she found bump on left breast that was red and tender. Patient has a history of a hysterectomy and states that las pap exam was done by Dr. Ronnald Ramp at [place] in 2018 and was normal.  -----------------------------------------------------------------   Review of Systems  Constitutional: Positive for chills and diaphoresis.  HENT: Negative.   Eyes: Negative.   Respiratory: Positive for apnea.   Cardiovascular: Negative.   Gastrointestinal: Negative.   Endocrine: Negative.   Genitourinary: Negative.   Musculoskeletal: Negative.   Skin: Negative.   Allergic/Immunologic: Positive for food allergies.  Neurological: Negative.   Hematological: Negative.   Psychiatric/Behavioral: Negative.     Social History She  reports that she has quit smoking. Her smoking use included cigarettes. She smoked 1.00 pack per day. She has never used smokeless tobacco. She reports that she does not drink alcohol or use drugs. Social History   Socioeconomic History  . Marital status: Married    Spouse name: Not on file  . Number of children: Not on file  . Years of education: Not on file  . Highest education level: Not on file  Occupational History  . Not on file  Social Needs  . Financial resource strain: Not on file  . Food insecurity:    Worry: Not on file    Inability: Not on file  . Transportation needs:    Medical: Not on  file    Non-medical: Not on file  Tobacco Use  . Smoking status: Former Smoker    Packs/day: 1.00    Types: Cigarettes  . Smokeless tobacco: Never Used  Substance and Sexual Activity  . Alcohol use: No    Alcohol/week: 0.0 oz  . Drug use: No  . Sexual activity: Yes  Lifestyle  . Physical activity:    Days per week: Not on file    Minutes per session: Not on file  . Stress: Not on file  Relationships  . Social connections:    Talks on phone: Not on file    Gets together: Not on file    Attends religious service: Not on file    Active member of club or organization: Not on file    Attends meetings of clubs or organizations: Not on file    Relationship status: Not on file  Other Topics Concern  . Not on file  Social History Narrative  . Not on file    Patient Active Problem List   Diagnosis Date Noted  . Status post bariatric surgery 06/11/2017  . 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 OSA on CPAP 06/11/2017  . BMI 33.0-33.9,adult 06/11/2017  . Viral warts 07/06/2014  . 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 Elevated blood pressure, situational 07/06/2014  . Carpal tunnel syndrome 07/06/2014  . Chronic obstructive pulmonary disease with acute lower resp infection ([place]) 07/06/2014  . Compulsive tobacco user syndrome 07/06/2014  . Extreme  obesity 07/06/2014    Past Surgical History:  Procedure Laterality Date  . FOOT SURGERY    . LAPAROSCOPIC GASTRIC SLEEVE RESECTION    . NASAL SINUS SURGERY    . VAGINAL HYSTERECTOMY  1995    Family History  Family Status  Relation Name Status  . Mother  Alive  . Father  Alive  . Mat Aunt  (Not Specified)  . MGM  (Not Specified)   Her family history includes Breast cancer in her maternal aunt and maternal grandmother.     Allergies  Allergen Reactions  . Galactose Anaphylaxis  . Grape Seed Anaphylaxis  . Omega 3  [Omega-3 Fatty Acids] Other (See Comments)    Tomatoes, dust and mold.  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 Other Other (See Comments)    Tomatoes, dust and mold. 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 Sulfa Antibiotics Other (See Comments)  . Sulfasalazine Nausea And Vomiting    Other reaction(s): Other (See Comments)  . Tomato     Previous Medications   ALBUTEROL (PROVENTIL HFA;VENTOLIN HFA) 108 (90 BASE) MCG/ACT INHALER    Inhale 1-2 puffs into the lungs every 6 (six) hours as needed for wheezing or shortness of breath.   CETIRIZINE (ZYRTEC) 10 MG TABLET    Take 10 mg by mouth daily.   EPINEPHRINE 0.3 MG/0.3 ML IJ SOAJ INJECTION    INJ 0.3 ML IM ONCE FOR 1 DOSE   MUPIROCIN NASAL OINTMENT (BACTROBAN NASAL) 2 %    Place 1 application into the nose 2 (two) times daily.   PANTOPRAZOLE (PROTONIX) 40 MG TABLET    TK 1 T PO D   SPACER/AERO-HOLDING CHAMBERS (AEROCHAMBER PLUS) INHALER    Use as instructed    Patient Care Team: Mar Daring, PA-C as PCP - General (Family Medicine)      Objective:   Vitals: BP 112/80   Pulse 73   Temp 98.3 F (36.8 C) (Oral)   Resp 16   Ht 5\' 5"  (1.651 m)   Wt 192 lb (87.1 kg)   LMP  (Exact Date)   SpO2 97%   BMI 31.95 kg/m    Physical Exam  Constitutional: She is oriented to person, place, and time. She appears well-developed and well-nourished. No distress.  HENT:  Head: Normocephalic and atraumatic.  Right Ear: Hearing, tympanic membrane, external ear and ear canal normal.  Left Ear: Hearing, tympanic membrane, external ear and ear canal normal.  Nose: Nose normal.  Mouth/Throat: Uvula is midline, oropharynx is clear and moist and mucous membranes are normal. No oropharyngeal exudate.  Eyes: Pupils are equal, round, and reactive to light. Conjunctivae and EOM are normal. Right eye exhibits no discharge. Left eye exhibits no discharge. No scleral icterus.  Neck: Normal range of motion. Neck supple. No JVD present. Carotid bruit is not present. No tracheal deviation  present. No thyromegaly present.  Cardiovascular: Normal rate, regular rhythm, normal heart sounds and intact distal pulses. Exam reveals no gallop and no friction rub.  No murmur heard. Pulmonary/Chest: Effort normal and breath sounds normal. No respiratory distress. She has no wheezes. She has no rales. She exhibits no tenderness. Right breast exhibits no inverted nipple, no mass, no nipple discharge, no skin change and no tenderness. Left breast exhibits no inverted nipple, no mass, no nipple discharge, no skin change and no tenderness. No breast swelling, tenderness, discharge or bleeding. Breasts are symmetrical.  Abdominal: Soft. Bowel sounds are  normal. She exhibits no distension and no mass. There is no tenderness. There is no rebound and no guarding.  Musculoskeletal: Normal range of motion. She exhibits no edema or tenderness.  Lymphadenopathy:    She has no cervical adenopathy.  Neurological: She is alert and oriented to person, place, and time.  Skin: Skin is warm and dry. No rash noted. She is not diaphoretic.  Psychiatric: She has a normal mood and affect. Her behavior is normal. Judgment and thought content normal.  Vitals reviewed.    Depression Screen PHQ 2/9 Scores 07/15/2017  PHQ - 2 Score 0      Assessment & Plan:     Routine Health Maintenance and Physical Exam  Exercise Activities and Dietary recommendations Goals    None      Immunization History  Administered Date(s) Administered  . Td 02/23/2005    Health Maintenance  Topic Date Due  . HIV Screening  02/20/1983  . TETANUS/TDAP  02/24/2015  . INFLUENZA VACCINE  09/23/2017  . PAP SMEAR  06/06/2019     Discussed health benefits of physical activity, and encouraged her to engage in regular exercise appropriate for her age and condition.    1. Annual physical exam Normal physical exam today. Labs checked previously and reviewed today in the office. She is ok to return in one year. She is to call  the office in the meantime if she has any acute issue, questions or concerns.  2. Breast cancer screening Breast exam today was normal. There is no family history of breast cancer. She does perform regular self breast exams. Mammogram was ordered as below. Information for [facility] clinic was given to patient so she may schedule her mammogram at her convenience. - MM Digital Screening; Future  3. Colon cancer screening - Ambulatory referral to Gastroenterology  4. Serum calcium elevated Previously elevated but was on 3 calcium supplements from Dr. Darnell Level, bariatric surgeon secondary to gastric sleeve. She has stopped supplementation. Will f/u pending results. If low, may need supplement but will decrease to 1-2 daily instead of 3.  - Basic Metabolic Panel (BMET)  5. Leukocytosis, unspecified type Previously elevated. Checking for clearance. Will f/u pending labs.  - CBC  --------------------------------------------------------------------

## 2017-07-16 ENCOUNTER — Telehealth: Payer: Self-pay | Admitting: Physician Assistant

## 2017-07-16 ENCOUNTER — Telehealth: Payer: Self-pay

## 2017-07-16 DIAGNOSIS — D72829 Elevated white blood cell count, unspecified: Secondary | ICD-10-CM

## 2017-07-16 LAB — CBC
Hematocrit: 46.5 % (ref 34.0–46.6)
Hemoglobin: 16.5 g/dL — ABNORMAL HIGH (ref 11.1–15.9)
MCH: 29.4 pg (ref 26.6–33.0)
MCHC: 35.5 g/dL (ref 31.5–35.7)
MCV: 83 fL (ref 79–97)
Platelets: 250 10*3/uL (ref 150–450)
RBC: 5.62 x10E6/uL — ABNORMAL HIGH (ref 3.77–5.28)
RDW: 15.2 % (ref 12.3–15.4)
WBC: 12.7 10*3/uL — ABNORMAL HIGH (ref 3.4–10.8)

## 2017-07-16 LAB — BASIC METABOLIC PANEL
BUN/Creatinine Ratio: 29 — ABNORMAL HIGH (ref 9–23)
BUN: 21 mg/dL (ref 6–24)
CO2: 25 mmol/L (ref 20–29)
Calcium: 10.2 mg/dL (ref 8.7–10.2)
Chloride: 101 mmol/L (ref 96–106)
Creatinine, Ser: 0.72 mg/dL (ref 0.57–1.00)
GFR calc Af Amer: 114 mL/min/{1.73_m2} (ref >59)
GFR, EST NON AFRICAN AMERICAN: 99 mL/min/{1.73_m2} (ref >59)
Glucose: 78 mg/dL (ref 65–99)
POTASSIUM: 4.2 mmol/L (ref 3.5–5.2)
SODIUM: 143 mmol/L (ref 134–144)

## 2017-07-16 MED ORDER — AMOXICILLIN-POT CLAVULANATE 875-125 MG PO TABS: 1.0000 | ORAL_TABLET | Freq: Two times a day (BID) | ORAL | 0 refills | Status: DC

## 2017-07-16 NOTE — Telephone Encounter (Signed)
I will send in augmentin for elevated WBC count. We will recheck in 2-4 weeks and if still elevated I will send to hematology.

## 2017-07-16 NOTE — Telephone Encounter (Signed)
Patient advised as below. Patient verbalizes understanding and is in agreement with treatment plan.  

## 2017-07-16 NOTE — Telephone Encounter (Signed)
Pt stated after getting her lab results from [facility] this morning pt has been worrying about her elevated WBC. Pt is requesting to discuss this with Tawanna Sat. Pt also stated that if Tawanna Sat was going to call any medications to possibly help with her WBC she would need it sent prior to 3 pm because she is leaving to ogo out of town today. Please advise. Thanks TNP

## 2017-07-16 NOTE — Telephone Encounter (Signed)
-----   Message from Mar Daring, [place] sent at 07/16/2017  8:31 AM EDT ----- Calcium is better and normal. Continue to not take calcium supplement and let Dr. Darnell Level know when you see him. WBC count continues to increase. Are you having any URI or UTI symptoms?

## 2017-07-16 NOTE — Telephone Encounter (Signed)
Patient advised as below. Patient denies any URI or UTI symptoms

## 2017-07-22 ENCOUNTER — Other Ambulatory Visit: Payer: Self-pay

## 2017-07-22 ENCOUNTER — Telehealth: Payer: Self-pay

## 2017-07-22 DIAGNOSIS — Z1211 Encounter for screening for malignant neoplasm of colon: Secondary | ICD-10-CM

## 2017-07-22 NOTE — Telephone Encounter (Signed)
Discussed colonoscopy with Dr. Bonna Gains because patient states her pouch can only hold 4 oz of liquids at a time.  Dr. Bonna Gains has explained to me that patient can still have her colonoscopy. Patient has been advised her rx Suprep bowel kit can be started the day before in the am instead of the pm allowing her more time to sip on the bowel prep throughout the day without .    Thanks [employer]

## 2017-07-29 ENCOUNTER — Ambulatory Visit
Admission: RE | Admit: 2017-07-29 | Discharge: 2017-07-29 | Disposition: A | Discharge status: Home / Self Care | Payer: Managed Care, Other (non HMO) | Source: Ambulatory Visit | Attending: Physician Assistant | Admitting: Physician Assistant

## 2017-07-29 ENCOUNTER — Telehealth: Payer: Self-pay | Admitting: Physician Assistant

## 2017-07-29 DIAGNOSIS — Z1231 Encounter for screening mammogram for malignant neoplasm of breast: Secondary | ICD-10-CM | POA: Diagnosis present

## 2017-07-29 DIAGNOSIS — Z1239 Encounter for other screening for malignant neoplasm of breast: Secondary | ICD-10-CM

## 2017-07-29 DIAGNOSIS — D72829 Elevated white blood cell count, unspecified: Secondary | ICD-10-CM

## 2017-07-29 NOTE — Telephone Encounter (Signed)
Patient states that she was supposed to have lab work repeated in two weeks and it has been two weeks today.  She is requesting a lab order to have this done.  Please let patient know when ready.

## 2017-07-30 ENCOUNTER — Telehealth: Payer: Self-pay

## 2017-07-30 NOTE — Telephone Encounter (Signed)
Lab ordered.

## 2017-07-30 NOTE — Telephone Encounter (Signed)
-----   Message from Mar Daring, PA-C sent at 07/30/2017 10:31 AM EDT ----- Normal mammogram. Repeat screening in one year.

## 2017-07-30 NOTE — Telephone Encounter (Signed)
Patient advised as directed below.  Thanks,  -Seaborn Nakama 

## 2017-07-31 LAB — CBC WITH DIFFERENTIAL/PLATELET
BASOS ABS: 0 10*3/uL (ref 0.0–0.2)
Basos: 0 %
EOS (ABSOLUTE): 0.2 10*3/uL (ref 0.0–0.4)
Eos: 2 %
Hematocrit: 47.3 % — ABNORMAL HIGH (ref 34.0–46.6)
Hemoglobin: 16.1 g/dL — ABNORMAL HIGH (ref 11.1–15.9)
Immature Grans (Abs): 0 10*3/uL (ref 0.0–0.1)
Immature Granulocytes: 0 %
LYMPHS ABS: 4.8 10*3/uL — AB (ref 0.7–3.1)
Lymphs: 41 %
MCH: 29.1 pg (ref 26.6–33.0)
MCHC: 34 g/dL (ref 31.5–35.7)
MCV: 85 fL (ref 79–97)
MONOS ABS: 0.7 10*3/uL (ref 0.1–0.9)
Monocytes: 6 %
Neutrophils Absolute: 6.2 10*3/uL (ref 1.4–7.0)
Neutrophils: 51 %
Platelets: 254 10*3/uL (ref 150–450)
RBC: 5.54 x10E6/uL — AB (ref 3.77–5.28)
RDW: 14.7 % (ref 12.3–15.4)
WBC: 11.9 10*3/uL — AB (ref 3.4–10.8)

## 2017-08-03 ENCOUNTER — Telehealth: Payer: Self-pay

## 2017-08-03 NOTE — Telephone Encounter (Signed)
-----   Message from Mar Daring, PA-C sent at 08/02/2017  5:36 PM EDT ----- WBC count continues to slowly decrease. This is consistent with possible reactive response to some viral infection or stress response in the body. Can recheck in 3 months for clearance.

## 2017-08-03 NOTE — Telephone Encounter (Signed)
Viewed by Flavia Shipper on 08/02/2017 5:40 PM

## 2017-10-21 ENCOUNTER — Encounter: Payer: Self-pay | Admitting: Physician Assistant

## 2017-10-21 ENCOUNTER — Ambulatory Visit
Admission: RE | Admit: 2017-10-21 | Discharge: 2017-10-21 | Disposition: A | Discharge status: Home / Self Care | Payer: Managed Care, Other (non HMO) | Source: Ambulatory Visit | Attending: Physician Assistant | Admitting: Physician Assistant

## 2017-10-21 ENCOUNTER — Ambulatory Visit: Payer: Managed Care, Other (non HMO) | Admitting: Physician Assistant

## 2017-10-21 VITALS — BP 110/78 | HR 89 | Temp 98.2°F | Resp 16 | Wt 176.6 lb

## 2017-10-21 DIAGNOSIS — M19012 Primary osteoarthritis, left shoulder: Secondary | ICD-10-CM | POA: Insufficient documentation

## 2017-10-21 DIAGNOSIS — M25512 Pain in left shoulder: Secondary | ICD-10-CM | POA: Diagnosis present

## 2017-10-21 DIAGNOSIS — M62838 Other muscle spasm: Secondary | ICD-10-CM | POA: Diagnosis not present

## 2017-10-21 MED ORDER — CYCLOBENZAPRINE HCL 5 MG PO TABS: 5.0000 mg | ORAL_TABLET | Freq: Three times a day (TID) | ORAL | 1 refills | Status: DC | PRN

## 2017-10-21 MED ORDER — METHYLPREDNISOLONE 4 MG PO TBPK: ORAL_TABLET | ORAL | 0 refills | Status: DC

## 2017-10-21 NOTE — Progress Notes (Signed)
Patient: Sarah Pham Female    DOB: Jul 20, 1965   52 y.o.   MRN: [id number] Visit Date: 10/21/2017  Today's Provider: Mar Daring, PA-C   Chief Complaint  Patient presents with  . Shoulder Pain   Subjective:    Shoulder Pain   The pain is present in the left shoulder and left arm. This is a new (Reports that she had a trauma to her shoulder 3 years ago.) problem. The problem occurs constantly ("burning"). The problem has been gradually worsening. The quality of the pain is described as burning and aching. The pain is at a severity of 10/10. The pain is severe. Associated symptoms include an inability to bear weight and a limited range of motion. Pertinent negatives include no numbness, stiffness or tingling. The symptoms are aggravated by activity. She has tried OTC pain meds, heat and cold (Biofreeze, cream with lidocaine) for the symptoms. The treatment provided no relief.   She reports on Saturday she was reaching out and slightly behind her to give her daughter a drink and someone hit her arm. She felt a pop like sensation and immediately had pain severe enough it caused nausea. She has had weakness of the arm since. Reports even having difficulty with pulling covers over herself in bed.     Allergies  Allergen Reactions  . Galactose Anaphylaxis  . Grape Seed Anaphylaxis  . Omega 3  [Omega-3 Fatty Acids] Other (See Comments)    Tomatoes, dust and mold. 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 Other Other (See Comments)    Tomatoes, dust and mold. 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 Sulfa Antibiotics Other (See Comments)  . Sulfasalazine Nausea And Vomiting    Other reaction(s): Other (See Comments)  . Tomato      Current Outpatient Medications:  .  BIOTIN PO, Take by mouth., Disp: , Rfl:  .  Calcium 500 MG CHEW, Chew by mouth., Disp: , Rfl:  .  cetirizine (ZYRTEC) 10 MG tablet, Take 10 mg by mouth daily., Disp: , Rfl:  .   EPINEPHrine 0.3 mg/0.3 mL IJ SOAJ injection, INJ 0.3 ML IM ONCE FOR 1 DOSE, Disp: , Rfl: 3 .  Multiple Vitamins-Minerals (BARIATRIC MULTIVITAMINS/IRON) CAPS, Take by mouth daily., Disp: , Rfl:  .  albuterol (PROVENTIL HFA;VENTOLIN HFA) 108 (90 Base) MCG/ACT inhaler, Inhale 1-2 puffs into the lungs every 6 (six) hours as needed for wheezing or shortness of breath. (Patient not taking: Reported on 10/21/2017), Disp: 1 Inhaler, Rfl: 0 .  mupirocin nasal ointment (BACTROBAN NASAL) 2 %, Place 1 application into the nose 2 (two) times daily. (Patient not taking: Reported on 07/15/2017), Disp: 10 g, Rfl: 0 .  pantoprazole (PROTONIX) 40 MG tablet, TK 1 T PO D, Disp: , Rfl: 5 .  Spacer/Aero-Holding Chambers (AEROCHAMBER PLUS) inhaler, Use as instructed (Patient not taking: Reported on 10/21/2017), Disp: 1 each, Rfl: 2  Review of Systems  Constitutional: Negative.   Respiratory: Negative.   Cardiovascular: Negative.   Gastrointestinal: Negative.   Musculoskeletal: Positive for arthralgias and myalgias. Negative for stiffness.  Skin: Negative for rash.  Neurological: Positive for weakness. Negative for tingling and numbness.    Social History   Tobacco Use  . Smoking status: Former Smoker    Packs/day: 1.00    Types: Cigarettes  . Smokeless tobacco: Never Used  Substance Use Topics  . Alcohol use: No    Alcohol/week: 0.0 standard  drinks   Objective:   BP 110/78 (BP Location: Right Arm, Patient Position: Sitting, Cuff Size: Normal)   Pulse 89   Temp 98.2 F (36.8 C) (Oral)   Resp 16   Wt 176 lb 9.6 oz (80.1 kg)   BMI 29.39 kg/m  Vitals:   10/21/17 1042  BP: 110/78  Pulse: 89  Resp: 16  Temp: 98.2 F (36.8 C)  TempSrc: Oral  Weight: 176 lb 9.6 oz (80.1 kg)     Physical Exam  Constitutional: She appears well-developed and well-nourished. No distress.  Neck: Normal range of motion. Neck supple.  Cardiovascular: Normal rate, regular rhythm and normal heart sounds. Exam reveals no  gallop and no friction rub.  No murmur heard. Pulmonary/Chest: Effort normal and breath sounds normal. No respiratory distress. She has no wheezes. She has no rales.  Musculoskeletal:       Left shoulder: She exhibits decreased range of motion, tenderness, pain, spasm (deltoid) and decreased strength. She exhibits no bony tenderness, no swelling, no effusion, no crepitus, no deformity, no laceration and normal pulse.       Cervical back: She exhibits spasm (upper trap left side).  Positive drop arm. Unable to perform apley scratch or empty can due to pain with positioning. Neurovasc grossly intact.  Skin: She is not diaphoretic.  Vitals reviewed.      Assessment & Plan:     1. Acute pain of left shoulder Suspect rotator cuff tedinopathy/tear. Will get xray as below. I will f/u pending results. Referral placed to orthopedics. Will give medrol dose pak for inflammation and flexeril for noted spasm in upper trap and deltoid muscles. Call if symptoms worsen in the meantime.  - Ambulatory referral to Orthopedic Surgery - DG Shoulder Left; Future  2. Muscle spasm See above medical treatment plan. - methylPREDNISolone (MEDROL) 4 MG TBPK tablet; 6 day taper; take as directed on package instructions  Dispense: 21 tablet; Refill: 0 - cyclobenzaprine (FLEXERIL) 5 MG tablet; Take 1 tablet (5 mg total) by mouth 3 (three) times daily as needed for muscle spasms.  Dispense: 30 tablet; Refill: [place], PA-C  [facility]

## 2017-12-01 ENCOUNTER — Ambulatory Visit: Payer: Managed Care, Other (non HMO) | Admitting: Physician Assistant

## 2017-12-01 ENCOUNTER — Encounter: Payer: Self-pay | Admitting: Physician Assistant

## 2017-12-01 VITALS — BP 118/68 | HR 77 | Temp 98.5°F | Wt 177.6 lb

## 2017-12-01 DIAGNOSIS — Z01818 Encounter for other preprocedural examination: Secondary | ICD-10-CM

## 2017-12-01 DIAGNOSIS — J418 Mixed simple and mucopurulent chronic bronchitis: Secondary | ICD-10-CM | POA: Diagnosis not present

## 2017-12-01 DIAGNOSIS — E756 Lipid storage disorder, unspecified: Secondary | ICD-10-CM | POA: Diagnosis not present

## 2017-12-01 DIAGNOSIS — E8809 Other disorders of plasma-protein metabolism, not elsewhere classified: Secondary | ICD-10-CM

## 2017-12-01 LAB — POCT URINALYSIS DIPSTICK
Appearance: NEGATIVE
Bilirubin, UA: NEGATIVE
Blood, UA: NEGATIVE
Glucose, UA: NEGATIVE
Ketones, UA: NEGATIVE
Leukocytes, UA: NEGATIVE
NITRITE UA: NEGATIVE
PROTEIN UA: NEGATIVE
SPEC GRAV UA: 1.015 (ref 1.010–1.025)
Urobilinogen, UA: 0.2 E.U./dL
pH, UA: 7 (ref 5.0–8.0)

## 2017-12-01 NOTE — Progress Notes (Signed)
Patient: Sarah Pham Female    DOB: 05/28/1965   52 y.o.   MRN: [id number] Visit Date: 12/01/2017  Today's Provider: Mar Daring, PA-C   No chief complaint on file.  Subjective:    HPI  Preoperative Patient present today  For pre op clearance for left shoulder arthroscopy. Surgery will be preformed by Dr. Harlow Mares at [facility] on December 31, 2017.   She has had surgery with general anesthesia before without complications.     Allergies  Allergen Reactions  . Galactose Anaphylaxis  . Grape Seed Anaphylaxis  . Omega 3  [Omega-3 Fatty Acids] Other (See Comments)    Tomatoes, dust and mold. 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 Other Other (See Comments)    Tomatoes, dust and mold. 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 Sulfa Antibiotics Other (See Comments)  . Sulfasalazine Nausea And Vomiting    Other reaction(s): Other (See Comments)  . Tomato      Current Outpatient Medications:  .  BIOTIN PO, Take by mouth., Disp: , Rfl:  .  Calcium 500 MG CHEW, Chew by mouth., Disp: , Rfl:  .  cetirizine (ZYRTEC) 10 MG tablet, Take 10 mg by mouth daily., Disp: , Rfl:  .  cyclobenzaprine (FLEXERIL) 5 MG tablet, Take 1 tablet (5 mg total) by mouth 3 (three) times daily as needed for muscle spasms., Disp: 30 tablet, Rfl: 1 .  EPINEPHrine 0.3 mg/0.3 mL IJ SOAJ injection, INJ 0.3 ML IM ONCE FOR 1 DOSE, Disp: , Rfl: 3 .  Multiple Vitamins-Minerals (BARIATRIC MULTIVITAMINS/IRON) CAPS, Take by mouth daily., Disp: , Rfl:  .  mupirocin nasal ointment (BACTROBAN NASAL) 2 %, Place 1 application into the nose 2 (two) times daily., Disp: 10 g, Rfl: 0 .  Spacer/Aero-Holding Chambers (AEROCHAMBER PLUS) inhaler, Use as instructed, Disp: 1 each, Rfl: 2 .  albuterol (PROVENTIL HFA;VENTOLIN HFA) 108 (90 Base) MCG/ACT inhaler, Inhale 1-2 puffs into the lungs every 6 (six) hours as needed for wheezing or shortness of breath. (Patient not taking: Reported on  12/01/2017), Disp: 1 Inhaler, Rfl: 0 .  methylPREDNISolone (MEDROL) 4 MG TBPK tablet, 6 day taper; take as directed on package instructions, Disp: 21 tablet, Rfl: 0 .  pantoprazole (PROTONIX) 40 MG tablet, TK 1 T PO D, Disp: , Rfl: 5  Review of Systems  Constitutional: Negative.   Respiratory: Negative.   Cardiovascular: Negative.   Gastrointestinal: Negative.   Musculoskeletal: Positive for arthralgias.  Neurological: Negative.     Social History   Tobacco Use  . Smoking status: Former Smoker    Packs/day: 1.00    Types: Cigarettes  . Smokeless tobacco: Never Used  Substance Use Topics  . Alcohol use: No    Alcohol/week: 0.0 standard drinks   Objective:   BP 118/68 (BP Location: Right Arm, Patient Position: Sitting, Cuff Size: Normal)   Pulse 77   Temp 98.5 F (36.9 C) (Oral)   Wt 177 lb 9.6 oz (80.6 kg)   SpO2 97%   BMI 29.55 kg/m  Vitals:   12/01/17 1558  BP: 118/68  Pulse: 77  Temp: 98.5 F (36.9 C)  TempSrc: Oral  SpO2: 97%  Weight: 177 lb 9.6 oz (80.6 kg)     Physical Exam  Constitutional: She is oriented to person, place, and time. She appears well-developed and well-nourished. No distress.  HENT:  Head: Normocephalic and atraumatic.  Right Ear: Hearing, tympanic membrane, external ear  and ear canal normal.  Left Ear: Hearing, tympanic membrane, external ear and ear canal normal.  Nose: Nose normal.  Mouth/Throat: Uvula is midline, oropharynx is clear and moist and mucous membranes are normal. No oropharyngeal exudate.  Eyes: Pupils are equal, round, and reactive to light. Conjunctivae and EOM are normal. Right eye exhibits no discharge. Left eye exhibits no discharge. No scleral icterus.  Neck: Normal range of motion. Neck supple. No JVD present. No tracheal deviation present. No thyromegaly present.  Cardiovascular: Normal rate, regular rhythm, normal heart sounds and intact distal pulses. Exam reveals no gallop and no friction rub.  No murmur  heard. Pulmonary/Chest: Effort normal and breath sounds normal. No respiratory distress. She has no wheezes. She has no rales. She exhibits no tenderness.  Abdominal: Soft. Bowel sounds are normal. She exhibits no distension and no mass. There is no tenderness. There is no rebound and no guarding.  Musculoskeletal: Normal range of motion. She exhibits no edema or tenderness.  Lymphadenopathy:    She has no cervical adenopathy.  Neurological: She is alert and oriented to person, place, and time.  Skin: Skin is warm and dry. No rash noted. She is not diaphoretic.  Psychiatric: She has a normal mood and affect. Her behavior is normal. Judgment and thought content normal.  Vitals reviewed.       Assessment & Plan:     1. Pre-operative clearance EKG shows NSR with low voltage rate of 73 without ST changes. Patient does have known COPD which is most likely cause of low voltage in precordial leads. CXR in January 2019 was normal. UA normal today. Patient is considered low risk for arthroscopy of the left shoulder with a risk of 0.1% for peri- and post-operative complications.  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   3. Mixed simple and mucopurulent chronic bronchitis (HCC) Stable.        Mar Daring, PA-C  [facility]

## 2017-12-02 LAB — CBC WITH DIFFERENTIAL/PLATELET
BASOS: 0 %
Basophils Absolute: 0.1 10*3/uL (ref 0.0–0.2)
EOS (ABSOLUTE): 0.2 10*3/uL (ref 0.0–0.4)
EOS: 2 %
HEMATOCRIT: 43.8 % (ref 34.0–46.6)
Hemoglobin: 15.1 g/dL (ref 11.1–15.9)
IMMATURE GRANS (ABS): 0 10*3/uL (ref 0.0–0.1)
Immature Granulocytes: 0 %
Lymphocytes Absolute: 4.5 10*3/uL — ABNORMAL HIGH (ref 0.7–3.1)
Lymphs: 32 %
MCH: 29.5 pg (ref 26.6–33.0)
MCHC: 34.5 g/dL (ref 31.5–35.7)
MCV: 86 fL (ref 79–97)
Monocytes Absolute: 0.8 10*3/uL (ref 0.1–0.9)
Monocytes: 5 %
NEUTROS ABS: 8.3 10*3/uL — AB (ref 1.4–7.0)
Neutrophils: 61 %
PLATELETS: 280 10*3/uL (ref 150–450)
RBC: 5.12 x10E6/uL (ref 3.77–5.28)
RDW: 12.8 % (ref 12.3–15.4)
WBC: 13.8 10*3/uL — AB (ref 3.4–10.8)

## 2017-12-02 LAB — COMPREHENSIVE METABOLIC PANEL
A/G RATIO: 1.7 (ref 1.2–2.2)
ALT: 24 IU/L (ref 0–32)
AST: 19 IU/L (ref 0–40)
Albumin: 4.2 g/dL (ref 3.5–5.5)
Alkaline Phosphatase: 72 IU/L (ref 39–117)
BILIRUBIN TOTAL: 0.2 mg/dL (ref 0.0–1.2)
BUN / CREAT RATIO: 33 — AB (ref 9–23)
BUN: 20 mg/dL (ref 6–24)
CALCIUM: 9.9 mg/dL (ref 8.7–10.2)
CHLORIDE: 104 mmol/L (ref 96–106)
CO2: 25 mmol/L (ref 20–29)
Creatinine, Ser: 0.6 mg/dL (ref 0.57–1.00)
GFR, EST AFRICAN AMERICAN: 124 mL/min/{1.73_m2} (ref >59)
GFR, EST NON AFRICAN AMERICAN: 107 mL/min/{1.73_m2} (ref >59)
GLOBULIN, TOTAL: 2.5 g/dL (ref 1.5–4.5)
Glucose: 81 mg/dL (ref 65–99)
POTASSIUM: 4.4 mmol/L (ref 3.5–5.2)
SODIUM: 145 mmol/L — AB (ref 134–144)
TOTAL PROTEIN: 6.7 g/dL (ref 6.0–8.5)

## 2017-12-02 LAB — HEMOGLOBIN A1C
Est. average glucose Bld gHb Est-mCnc: 105 mg/dL
Hgb A1c MFr Bld: 5.3 % (ref 4.8–5.6)

## 2017-12-03 ENCOUNTER — Encounter: Payer: Self-pay | Admitting: Physician Assistant

## 2017-12-06 ENCOUNTER — Encounter: Payer: Self-pay | Admitting: Physician Assistant

## 2017-12-06 ENCOUNTER — Ambulatory Visit: Payer: Managed Care, Other (non HMO) | Admitting: Physician Assistant

## 2017-12-06 VITALS — BP 120/70 | HR 88 | Temp 99.0°F | Wt 174.6 lb

## 2017-12-06 DIAGNOSIS — J40 Bronchitis, not specified as acute or chronic: Secondary | ICD-10-CM | POA: Diagnosis not present

## 2017-12-06 MED ORDER — AZITHROMYCIN 250 MG PO TABS: ORAL_TABLET | ORAL | 0 refills | Status: DC

## 2017-12-06 MED ORDER — PREDNISONE 10 MG (21) PO TBPK: ORAL_TABLET | ORAL | 0 refills | Status: DC

## 2017-12-06 NOTE — Patient Instructions (Signed)

## 2017-12-06 NOTE — Progress Notes (Signed)
Patient: Sarah Pham Female    DOB: March 20, 1965   52 y.o.   MRN: [id number] Visit Date: 12/06/2017  Today's Provider: Mar Daring, PA-C   Chief Complaint  Patient presents with  . URI   Subjective:    HPI Upper Respiratory Infection Patient presents today with URI symptoms (cough, runny nose, and congestion) since 12/01/2017. Patient states she have been having low grade fevers since Friday, 12/03/17 arranging from 99-102.  Patient has been talking Tylenol and Nyquil for the symptoms. Patient presents with a low grade fever of 99.0 after taking Tylenol this morning at 8 am.    Allergies  Allergen Reactions  . Galactose Anaphylaxis  . Grape Seed Anaphylaxis  . Omega 3  [Omega-3 Fatty Acids] Other (See Comments)    Tomatoes, dust and mold. 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 Other Other (See Comments)    Tomatoes, dust and mold. 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 Sulfa Antibiotics Other (See Comments)  . Sulfasalazine Nausea And Vomiting    Other reaction(s): Other (See Comments)  . Tomato      Current Outpatient Medications:  .  albuterol (PROVENTIL HFA;VENTOLIN HFA) 108 (90 Base) MCG/ACT inhaler, Inhale 1-2 puffs into the lungs every 6 (six) hours as needed for wheezing or shortness of breath., Disp: 1 Inhaler, Rfl: 0 .  BIOTIN PO, Take by mouth., Disp: , Rfl:  .  Calcium 500 MG CHEW, Chew by mouth., Disp: , Rfl:  .  cetirizine (ZYRTEC) 10 MG tablet, Take 10 mg by mouth daily., Disp: , Rfl:  .  cyclobenzaprine (FLEXERIL) 5 MG tablet, Take 1 tablet (5 mg total) by mouth 3 (three) times daily as needed for muscle spasms., Disp: 30 tablet, Rfl: 1 .  EPINEPHrine 0.3 mg/0.3 mL IJ SOAJ injection, INJ 0.3 ML IM ONCE FOR 1 DOSE, Disp: , Rfl: 3 .  methylPREDNISolone (MEDROL) 4 MG TBPK tablet, 6 day taper; take as directed on package instructions, Disp: 21 tablet, Rfl: 0 .  Multiple Vitamins-Minerals (BARIATRIC  MULTIVITAMINS/IRON) CAPS, Take by mouth daily., Disp: , Rfl:  .  mupirocin nasal ointment (BACTROBAN NASAL) 2 %, Place 1 application into the nose 2 (two) times daily., Disp: 10 g, Rfl: 0 .  pantoprazole (PROTONIX) 40 MG tablet, TK 1 T PO D, Disp: , Rfl: 5 .  Spacer/Aero-Holding Chambers (AEROCHAMBER PLUS) inhaler, Use as instructed, Disp: 1 each, Rfl: 2  Review of Systems  Constitutional: Positive for appetite change, chills and fever.  HENT: Positive for congestion. Negative for sore throat.   Eyes: Negative.   Respiratory: Positive for cough. Negative for shortness of breath and wheezing.   Gastrointestinal: Negative.   Musculoskeletal: Negative.   Allergic/Immunologic: Negative.     Social History   Tobacco Use  . Smoking status: Former Smoker    Packs/day: 1.00    Types: Cigarettes  . Smokeless tobacco: Never Used  Substance Use Topics  . Alcohol use: No    Alcohol/week: 0.0 standard drinks   Objective:   BP 120/70 (BP Location: Right Arm, Patient Position: Sitting, Cuff Size: Normal)   Pulse 88   Temp 99 F (37.2 C) (Oral)   Wt 174 lb 9.6 oz (79.2 kg)   SpO2 95%   BMI 29.05 kg/m  Vitals:   12/06/17 1040  BP: 120/70  Pulse: 88  Temp: 99 F (37.2 C)  TempSrc: Oral  SpO2: 95%  Weight: 174 lb  9.6 oz (79.2 kg)     Physical Exam  Constitutional: She appears well-developed and well-nourished. No distress.  HENT:  Head: Normocephalic and atraumatic.  Right Ear: Hearing, tympanic membrane, external ear and ear canal normal.  Left Ear: Hearing, tympanic membrane, external ear and ear canal normal.  Nose: Mucosal edema present. Right sinus exhibits no maxillary sinus tenderness and no frontal sinus tenderness. Left sinus exhibits no maxillary sinus tenderness and no frontal sinus tenderness.  Mouth/Throat: Uvula is midline, oropharynx is clear and moist and mucous membranes are normal. No oropharyngeal exudate, posterior oropharyngeal edema or posterior oropharyngeal  erythema.  Neck: Normal range of motion. Neck supple. No tracheal deviation present. No thyromegaly present.  Cardiovascular: Normal rate, regular rhythm and normal heart sounds. Exam reveals no gallop and no friction rub.  No murmur heard. Pulmonary/Chest: Effort normal. No stridor. No respiratory distress. She has wheezes (expiratory throughout). She has no rales.  Lymphadenopathy:    She has no cervical adenopathy.  Skin: She is not diaphoretic.  Vitals reviewed.       Assessment & Plan:     1. Bronchitis Worsening. Will treat with Zpak and prednisone taper as below. May use Mucinex DM for cough and congestion. Push fluids. Work note given to excuse from work. Call if symptoms fail to improve.  - azithromycin (ZITHROMAX) 250 MG tablet; Take 2 tablets PO on day one, and one tablet PO daily thereafter until completed.  Dispense: 6 tablet; Refill: 0 - predniSONE (STERAPRED UNI-PAK 21 TAB) 10 MG (21) TBPK tablet; 6 day taper; take as directed on package instructions  Dispense: 21 tablet; Refill: 0       Mar Daring, PA-C  [facility]

## 2017-12-31 DIAGNOSIS — Z9889 Other specified postprocedural states: Secondary | ICD-10-CM | POA: Insufficient documentation

## 2017-12-31 HISTORY — PX: ROTATOR CUFF REPAIR: SHX139

## 2018-02-07 ENCOUNTER — Encounter: Payer: Self-pay | Admitting: Emergency Medicine

## 2018-02-07 ENCOUNTER — Other Ambulatory Visit: Payer: Self-pay

## 2018-02-07 ENCOUNTER — Ambulatory Visit
Admission: EM | Admit: 2018-02-07 | Discharge: 2018-02-07 | Disposition: A | Discharge status: Home / Self Care | Payer: Managed Care, Other (non HMO) | Attending: Family Medicine | Admitting: Family Medicine

## 2018-02-07 DIAGNOSIS — R059 Cough, unspecified: Secondary | ICD-10-CM

## 2018-02-07 DIAGNOSIS — H6503 Acute serous otitis media, bilateral: Secondary | ICD-10-CM | POA: Diagnosis not present

## 2018-02-07 DIAGNOSIS — R05 Cough: Secondary | ICD-10-CM | POA: Insufficient documentation

## 2018-02-07 MED ORDER — AMOXICILLIN 875 MG PO TABS: 875.0000 mg | ORAL_TABLET | Freq: Two times a day (BID) | ORAL | 0 refills | Status: DC

## 2018-02-07 NOTE — ED Provider Notes (Signed)
[facility]    CSN: [id number] Arrival date & time: 02/07/18  0809     History   Chief Complaint Chief Complaint  Patient presents with  . Cough    HPI Sarah Pham is a 52 y.o. female.   The history is provided by the patient.  Cough  Associated symptoms: ear pain, rhinorrhea and sore throat   Associated symptoms: no wheezing   URI  Presenting symptoms: congestion, cough, ear pain, facial pain, rhinorrhea and sore throat   Severity:  Moderate Onset quality:  Sudden Duration:  10 days Timing:  Constant Progression:  Unchanged Chronicity:  New Relieved by:  Nothing Ineffective treatments:  OTC medications Associated symptoms: no sinus pain and no wheezing   Risk factors: chronic respiratory disease and sick contacts   Risk factors: not elderly, no chronic cardiac disease, no chronic kidney disease and no diabetes mellitus     Past Medical History:  Diagnosis Date  . Hyperlipidemia   . Obesity     Patient Active Problem List   Diagnosis Date Noted  . Status post bariatric surgery 06/11/2017  . 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 OSA on CPAP 06/11/2017  . BMI 33.0-33.9,adult 06/11/2017  . Viral warts 07/06/2014  . 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 Elevated blood pressure, situational 07/06/2014  . Carpal tunnel syndrome 07/06/2014  . Chronic obstructive pulmonary disease with acute lower resp infection ([place]) 07/06/2014  . Compulsive tobacco user syndrome 07/06/2014  . Extreme obesity 07/06/2014    Past Surgical History:  Procedure Laterality Date  . FOOT SURGERY    . LAPAROSCOPIC GASTRIC SLEEVE RESECTION    . NASAL SINUS SURGERY    . OOPHORECTOMY Left    right was not removed  . VAGINAL HYSTERECTOMY  1995    OB History   No obstetric history on file.      Home Medications    Prior to Admission medications   Medication Sig Start Date End Date Taking? Authorizing Provider  Calcium 500 MG CHEW  Chew by mouth.   Yes [provider]  cyclobenzaprine (FLEXERIL) 5 MG tablet Take 1 tablet (5 mg total) by mouth 3 (three) times daily as needed for muscle spasms. 10/21/17  Yes Mar Daring, PA-C  EPINEPHrine 0.3 mg/0.3 mL IJ SOAJ injection INJ 0.3 ML IM ONCE FOR 1 DOSE 06/11/17  Yes [provider]  Multiple Vitamins-Minerals (BARIATRIC MULTIVITAMINS/IRON) CAPS Take by mouth daily.   Yes [provider]  pantoprazole (PROTONIX) 40 MG tablet TK 1 T PO D 06/11/17  Yes [provider]  albuterol (PROVENTIL HFA;VENTOLIN HFA) 108 (90 Base) MCG/ACT inhaler Inhale 1-2 puffs into the lungs every 6 (six) hours as needed for wheezing or shortness of breath. 03/11/17   Melynda Ripple, MD  amoxicillin (AMOXIL) 875 MG tablet Take 1 tablet (875 mg total) by mouth 2 (two) times daily. 02/07/18   Norval Gable, MD  azithromycin (ZITHROMAX) 250 MG tablet Take 2 tablets PO on day one, and one tablet PO daily thereafter until completed. 12/06/17   Mar Daring, PA-C  BIOTIN PO Take by mouth.    [provider]  cetirizine (ZYRTEC) 10 MG tablet Take 10 mg by mouth daily.    [provider]  methylPREDNISolone (MEDROL) 4 MG TBPK tablet 6 day taper; take as directed on package instructions 10/21/17   Mar Daring, PA-C  mupirocin nasal ointment (BACTROBAN NASAL) 2 % Place 1 application into the nose 2 (two) times daily.  06/11/17   Mar Daring, PA-C  predniSONE (STERAPRED UNI-PAK 21 TAB) 10 MG (21) TBPK tablet 6 day taper; take as directed on package instructions 12/06/17   Mar Daring, PA-C  Spacer/Aero-Holding Chambers (AEROCHAMBER PLUS) inhaler Use as instructed 03/11/17   Melynda Ripple, MD    Family History Family History  Problem Relation Age of Onset  . Breast cancer Maternal Aunt   . Breast cancer Maternal Grandmother     Social History Social History   Tobacco Use  . Smoking status: Former Smoker     Packs/day: 1.00    Types: Cigarettes  . Smokeless tobacco: Never Used  Substance Use Topics  . Alcohol use: No    Alcohol/week: 0.0 standard drinks  . Drug use: No     Allergies   Galactose; Grape seed; Omega 3  [omega-3 fatty acids]; Other; Sulfa antibiotics; Sulfasalazine; and Tomato   Review of Systems Review of Systems  HENT: Positive for congestion, ear pain, rhinorrhea and sore throat. Negative for sinus pain.   Respiratory: Positive for cough. Negative for wheezing.      Physical Exam Triage Vital Signs ED Triage Vitals  Enc Vitals Group     BP 02/07/18 0823 115/80     Pulse Rate 02/07/18 0823 73     Resp 02/07/18 0823 18     Temp 02/07/18 0823 97.9 F (36.6 C)     Temp Source 02/07/18 0823 Oral     SpO2 02/07/18 0823 96 %     Weight 02/07/18 0819 175 lb (79.4 kg)     Height 02/07/18 0819 5\' 5"  (1.651 m)     Head Circumference --      Peak Flow --      Pain Score 02/07/18 0819 4     Pain Loc --      Pain Edu? --      Excl. in [place]? --    No data found.  Updated Vital Signs BP 115/80 (BP Location: Left Arm)   Pulse 73   Temp 97.9 F (36.6 C) (Oral)   Resp 18   Ht 5\' 5"  (1.651 m)   Wt 79.4 kg   SpO2 96%   BMI 29.12 kg/m   Visual Acuity Right Eye Distance:   Left Eye Distance:   Bilateral Distance:    Right Eye Near:   Left Eye Near:    Bilateral Near:     Physical Exam Vitals signs and nursing note reviewed.  Constitutional:      General: She is not in acute distress.    Appearance: She is well-developed. She is not diaphoretic.  HENT:     Head: Normocephalic and atraumatic.     Right Ear: Tympanic membrane, ear canal and external ear normal.     Left Ear: Tympanic membrane, ear canal and external ear normal.     Nose: Mucosal edema and rhinorrhea present. No nasal deformity, septal deviation or laceration.     Right Sinus: Maxillary sinus tenderness and frontal sinus tenderness present.     Left Sinus: Maxillary sinus tenderness and  frontal sinus tenderness present.     Mouth/Throat:     Pharynx: Uvula midline. No oropharyngeal exudate.  Eyes:     General: No scleral icterus.       Right eye: No discharge.        Left eye: No discharge.     Conjunctiva/sclera: Conjunctivae normal.     Pupils: Pupils are equal, round, and reactive to light.  Neck:     Musculoskeletal: Normal range of motion and neck supple.     Thyroid: No thyromegaly.  Cardiovascular:     Rate and Rhythm: Normal rate and regular rhythm.     Heart sounds: Normal heart sounds.  Pulmonary:     Effort: Pulmonary effort is normal. No respiratory distress.     Breath sounds: Normal breath sounds. No wheezing or rales.  Lymphadenopathy:     Cervical: No cervical adenopathy.      UC Treatments / Results  Labs (all labs ordered are listed, but only abnormal results are displayed) Labs Reviewed - No data to display  EKG None  Radiology No results found.  Procedures Procedures (including critical care time)  Medications Ordered in UC Medications - No data to display  Initial Impression / Assessment and Plan / UC Course  I have reviewed the triage vital signs and the nursing notes.  Pertinent labs & imaging results that were available during my care of the patient were reviewed by me and considered in my medical decision making (see chart for details).      Final Clinical Impressions(s) / UC Diagnoses   Final diagnoses:  Bilateral acute serous otitis media, recurrence not specified  Cough    ED Prescriptions    Medication Sig Dispense Auth. Provider   amoxicillin (AMOXIL) 875 MG tablet Take 1 tablet (875 mg total) by mouth 2 (two) times daily. 20 tablet Norval Gable, MD     1. diagnosis reviewed with patient  2. rx as per orders above; reviewed possible side effects, interactions, risks and benefits  3. Recommend supportive treatment with rest, fluids 4. Follow-up prn if symptoms worsen or don't improve    Controlled  Substance Prescriptions [place] Controlled Substance Registry consulted? Not Applicable   Norval Gable, MD 02/07/18 [phone]

## 2018-02-07 NOTE — ED Triage Notes (Signed)
Pt c/o headache, cough, congestion, sore throat, left ear pain. Started about a week ago.

## 2018-02-28 ENCOUNTER — Other Ambulatory Visit: Payer: Self-pay

## 2018-02-28 ENCOUNTER — Encounter: Payer: Self-pay | Admitting: *Deleted

## 2018-03-03 NOTE — Discharge Instructions (Signed)
General Anesthesia, Adult, Care After  This sheet gives you information about how to care for yourself after your procedure. Your health care provider may also give you more specific instructions. If you have problems or questions, contact your health care provider.  What can I expect after the procedure?  After the procedure, the following side effects are common:  Pain or discomfort at the IV site.  Nausea.  Vomiting.  Sore throat.  Trouble concentrating.  Feeling cold or chills.  Weak or tired.  Sleepiness and fatigue.  Soreness and body aches. These side effects can affect parts of the body that were not involved in surgery.  Follow these instructions at home:    For at least 24 hours after the procedure:  Have a responsible adult stay with you. It is important to have someone help care for you until you are awake and alert.  Rest as needed.  Do not:  Participate in activities in which you could fall or become injured.  Drive.  Use heavy machinery.  Drink alcohol.  Take sleeping pills or medicines that cause drowsiness.  Make important decisions or sign legal documents.  Take care of children on your own.  Eating and drinking  Follow any instructions from your health care provider about eating or drinking restrictions.  When you feel hungry, start by eating small amounts of foods that are soft and easy to digest (bland), such as toast. Gradually return to your regular diet.  Drink enough fluid to keep your urine pale yellow.  If you vomit, rehydrate by drinking water, juice, or clear broth.  General instructions  If you have sleep apnea, surgery and certain medicines can increase your risk for breathing problems. Follow instructions from your health care provider about wearing your sleep device:  Anytime you are sleeping, including during daytime naps.  While taking prescription pain medicines, sleeping medicines, or medicines that make you drowsy.  Return to your normal activities as told by your health care  provider. Ask your health care provider what activities are safe for you.  Take over-the-counter and prescription medicines only as told by your health care provider.  If you smoke, do not smoke without supervision.  Keep all follow-up visits as told by your health care provider. This is important.  Contact a health care provider if:  You have nausea or vomiting that does not get better with medicine.  You cannot eat or drink without vomiting.  You have pain that does not get better with medicine.  You are unable to pass urine.  You develop a skin rash.  You have a fever.  You have redness around your IV site that gets worse.  Get help right away if:  You have difficulty breathing.  You have chest pain.  You have blood in your urine or stool, or you vomit blood.  Summary  After the procedure, it is common to have a sore throat or nausea. It is also common to feel tired.  Have a responsible adult stay with you for the first 24 hours after general anesthesia. It is important to have someone help care for you until you are awake and alert.  When you feel hungry, start by eating small amounts of foods that are soft and easy to digest (bland), such as toast. Gradually return to your regular diet.  Drink enough fluid to keep your urine pale yellow.  Return to your normal activities as told by your health care provider. Ask your health care   provider what activities are safe for you.  This information is not intended to replace advice given to you by your health care provider. Make sure you discuss any questions you have with your health care provider.  Document Released: 05/18/2000 Document Revised: 09/25/2016 Document Reviewed: 09/25/2016  Elsevier Interactive Patient Education  2019 [employer]

## 2018-03-04 ENCOUNTER — Encounter
Admission: RE | Disposition: A | Discharge status: Home / Self Care | Payer: Self-pay | Source: Home / Self Care | Attending: Gastroenterology

## 2018-03-04 ENCOUNTER — Ambulatory Visit: Payer: Managed Care, Other (non HMO) | Admitting: Anesthesiology

## 2018-03-04 ENCOUNTER — Ambulatory Visit
Admission: RE | Admit: 2018-03-04 | Discharge: 2018-03-04 | Disposition: A | Discharge status: Home / Self Care | Payer: Managed Care, Other (non HMO) | Attending: Gastroenterology | Admitting: Gastroenterology

## 2018-03-04 DIAGNOSIS — G4730 Sleep apnea, unspecified: Secondary | ICD-10-CM | POA: Insufficient documentation

## 2018-03-04 DIAGNOSIS — Z6829 Body mass index (BMI) 29.0-29.9, adult: Secondary | ICD-10-CM | POA: Insufficient documentation

## 2018-03-04 DIAGNOSIS — Z9884 Bariatric surgery status: Secondary | ICD-10-CM | POA: Diagnosis not present

## 2018-03-04 DIAGNOSIS — Z1211 Encounter for screening for malignant neoplasm of colon: Secondary | ICD-10-CM | POA: Insufficient documentation

## 2018-03-04 DIAGNOSIS — E669 Obesity, unspecified: Secondary | ICD-10-CM | POA: Diagnosis not present

## 2018-03-04 DIAGNOSIS — J449 Chronic obstructive pulmonary disease, unspecified: Secondary | ICD-10-CM | POA: Diagnosis not present

## 2018-03-04 DIAGNOSIS — Z79899 Other long term (current) drug therapy: Secondary | ICD-10-CM | POA: Diagnosis not present

## 2018-03-04 DIAGNOSIS — D123 Benign neoplasm of transverse colon: Secondary | ICD-10-CM

## 2018-03-04 HISTORY — DX: Presence of spectacles and contact lenses: Z97.3

## 2018-03-04 HISTORY — DX: Other symptoms and signs involving the musculoskeletal system: R29.898

## 2018-03-04 HISTORY — PX: POLYPECTOMY: SHX5525

## 2018-03-04 HISTORY — DX: Sleep apnea, unspecified: G47.30

## 2018-03-04 HISTORY — PX: COLONOSCOPY WITH PROPOFOL: SHX5780

## 2018-03-04 SURGERY — COLONOSCOPY WITH PROPOFOL
Anesthesia: General | Site: Rectum

## 2018-03-04 MED ORDER — LIDOCAINE HCL (CARDIAC) PF 100 MG/5ML IV SOSY
PREFILLED_SYRINGE | INTRAVENOUS | Status: DC | PRN
  Administered 2018-03-04: 30 mg via INTRAVENOUS

## 2018-03-04 MED ORDER — SODIUM CHLORIDE 0.9 % IV SOLN: INTRAVENOUS | Status: DC

## 2018-03-04 MED ORDER — STERILE WATER FOR IRRIGATION IR SOLN
Status: DC | PRN
  Administered 2018-03-04: 08:00:00

## 2018-03-04 MED ORDER — LACTATED RINGERS IV SOLN
10.0000 mL/h | INTRAVENOUS | Status: DC
  Administered 2018-03-04: 10 mL/h via INTRAVENOUS

## 2018-03-04 MED ORDER — LACTATED RINGERS IV SOLN: INTRAVENOUS | Status: DC

## 2018-03-04 MED ORDER — PROPOFOL 10 MG/ML IV BOLUS
INTRAVENOUS | Status: DC | PRN
  Administered 2018-03-04: 100 mg via INTRAVENOUS

## 2018-03-04 SURGICAL SUPPLY — 7 items
CANISTER SUCT 1200ML W/VALVE (MISCELLANEOUS) ×3 IMPLANT
GOWN CVR UNV OPN BCK APRN NK (MISCELLANEOUS) ×2 IMPLANT
GOWN ISOL THUMB LOOP REG UNIV (MISCELLANEOUS) ×6
KIT ENDO PROCEDURE OLY (KITS) ×3 IMPLANT
SNARE SHORT THROW 13M SML OVAL (MISCELLANEOUS) ×2 IMPLANT
TRAP ETRAP POLY (MISCELLANEOUS) ×2 IMPLANT
WATER STERILE IRR 250ML POUR (IV SOLUTION) ×3 IMPLANT

## 2018-03-04 NOTE — Transfer of Care (Signed)
Immediate Anesthesia Transfer of Care Note  Patient: Sarah Pham  Procedure(s) Performed: COLONOSCOPY WITH BIOPSIES (N/A Rectum) POLYPECTOMY (N/A Rectum)  Patient Location: PACU  Anesthesia Type: General  Level of Consciousness: awake, alert  and patient cooperative  Airway and Oxygen Therapy: Patient Spontanous Breathing and Patient connected to supplemental oxygen  Post-op Assessment: Post-op Vital signs reviewed, Patient's Cardiovascular Status Stable, Respiratory Function Stable, Patent Airway and No signs of Nausea or vomiting  Post-op Vital Signs: Reviewed and stable  Complications: No apparent anesthesia complications

## 2018-03-04 NOTE — Anesthesia Postprocedure Evaluation (Signed)
Anesthesia Post Note  Patient: Sarah Pham  Procedure(s) Performed: COLONOSCOPY WITH BIOPSIES (N/A Rectum) POLYPECTOMY (N/A Rectum)  Patient location during evaluation: PACU Anesthesia Type: General Level of consciousness: awake and alert Pain management: pain level controlled Vital Signs Assessment: post-procedure vital signs reviewed and stable Respiratory status: spontaneous breathing, nonlabored ventilation, respiratory function stable and patient connected to nasal cannula oxygen Cardiovascular status: blood pressure returned to baseline and stable Postop Assessment: no apparent nausea or vomiting Anesthetic complications: no    Cass Edinger

## 2018-03-04 NOTE — Op Note (Signed)
[facility] Gastroenterology Patient Name: Sarah Pham Procedure Date: 03/04/2018 7:51 AM MRN: [id number] Account #: [id number] Date of Birth: Nov 25, 1965 Admit Type: Outpatient Age: 53 Room: [facility] OR ROOM 01 Gender: Female Note Status: Finalized Procedure:            Colonoscopy Indications:          Screening for colorectal malignant neoplasm Providers:            [name] MD, MD Referring MD:         Mar Daring (Referring MD) Medicines:            Propofol per Anesthesia Complications:        No immediate complications. Procedure:            Pre-Anesthesia Assessment:                       - Prior to the procedure, a History and Physical was                        performed, and patient medications and allergies were                        reviewed. The patient's tolerance of previous                        anesthesia was also reviewed. The risks and benefits of                        the procedure and the sedation options and risks were                        discussed with the patient. All questions were                        answered, and informed consent was obtained. Prior                        Anticoagulants: The patient has taken no previous                        anticoagulant or antiplatelet agents. ASA Grade                        Assessment: II - A patient with mild systemic disease.                        After reviewing the risks and benefits, the patient was                        deemed in satisfactory condition to undergo the                        procedure.                       After obtaining informed consent, the colonoscope was                        passed under direct vision. Throughout the procedure,  the patient's blood pressure, pulse, and oxygen                        saturations were monitored continuously. The was                        introduced through the anus and advanced to the the                 cecum, identified by appendiceal orifice and ileocecal                        valve. The colonoscopy was performed without                        difficulty. The patient tolerated the procedure well.                        The quality of the bowel preparation was excellent. Findings:      The perianal and digital rectal examinations were normal.      A 5 mm polyp was found in the transverse colon. The polyp was sessile.       The polyp was removed with a cold snare. Resection and retrieval were       complete. Impression:           - One 5 mm polyp in the transverse colon, removed with                        a cold snare. Resected and retrieved. Recommendation:       - Discharge patient to home.                       - Resume previous diet.                       - Continue present medications.                       - Await pathology results.                       - Repeat colonoscopy in 5 years if polyp adenoma and 10                        years if hyperplastic Procedure Code(s):    --- Professional ---                       [phone], Colonoscopy, flexible; with removal of tumor(s),                        polyp(s), or other lesion(s) by snare technique Diagnosis Code(s):    --- Professional ---                       Z12.11, Encounter for screening for malignant neoplasm                        of colon                       D12.3, Benign neoplasm of transverse colon (hepatic  flexure or splenic flexure) CPT copyright 2018 American Medical Association. All rights reserved. The codes documented in this report are preliminary and upon coder review may  be revised to meet current compliance requirements. [name] MD, MD 03/04/2018 8:14:28 AM This report has been signed electronically. Number of Addenda: 0 Note Initiated On: 03/04/2018 7:51 AM Scope Withdrawal Time: 0 hours 7 minutes 14 seconds  Total Procedure Duration: 0 hours 12 minutes 14 seconds        [facility]

## 2018-03-04 NOTE — H&P (Signed)
[name], MD [address] [place], [place] [address] Phone: [phone] Fax : [phone]  Primary Care Physician:  Mar Daring, PA-C Primary Gastroenterologist:  Dr. Allen Norris  Pre-Procedure History & Physical: HPI:  Sarah Pham is a 53 y.o. female is here for a screening colonoscopy.   Past Medical History:  Diagnosis Date  . Hyperlipidemia   . Left arm weakness    due to recent RTR  . Obesity   . Sleep apnea    CPAP  . Wears contact lenses     Past Surgical History:  Procedure Laterality Date  . FOOT SURGERY    . LAPAROSCOPIC GASTRIC SLEEVE RESECTION    . NASAL SINUS SURGERY    . OOPHORECTOMY Left    right was not removed  . ROTATOR CUFF REPAIR Left 12/31/2017   [facility] Dr Harlow Mares.  Marland Kitchen VAGINAL HYSTERECTOMY  1995    Prior to Admission medications   Medication Sig Start Date End Date Taking? Authorizing Provider  acetaminophen (TYLENOL) 325 MG tablet Take 650 mg by mouth every 6 (six) hours as needed.   Yes [provider]  BIOTIN PO Take by mouth.   Yes [provider]  Calcium 500 MG CHEW Chew by mouth.   Yes [provider]  cetirizine (ZYRTEC) 10 MG tablet Take 10 mg by mouth daily.   Yes [provider]  cyclobenzaprine (FLEXERIL) 5 MG tablet Take 1 tablet (5 mg total) by mouth 3 (three) times daily as needed for muscle spasms. 10/21/17  Yes Mar Daring, PA-C  EPINEPHrine 0.3 mg/0.3 mL IJ SOAJ injection INJ 0.3 ML IM ONCE FOR 1 DOSE 06/11/17  Yes [provider]  Multiple Vitamins-Minerals (BARIATRIC MULTIVITAMINS/IRON) CAPS Take by mouth daily.   Yes [provider]  pantoprazole (PROTONIX) 40 MG tablet TK 1 T PO D 06/11/17  Yes [provider]  mupirocin nasal ointment (BACTROBAN NASAL) 2 % Place 1 application into the nose 2 (two) times daily. Patient not taking: Reported on 03/04/2018 06/11/17   Mar Daring, PA-C  Spacer/Aero-Holding Chambers  (AEROCHAMBER PLUS) inhaler Use as instructed Patient not taking: Reported on 02/28/2018 03/11/17   Melynda Ripple, MD    Allergies as of 07/22/2017 - Review Complete 07/15/2017  Allergen Reaction Noted  . 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 Omega 3  [omega-3 fatty acids] Other (See Comments) 12/05/2013  . Other Other (See Comments) 11/15/2014  . Sulfa antibiotics Other (See Comments) 07/06/2014  . Sulfasalazine Nausea And Vomiting 07/06/2014  . Tomato  07/06/2014    Family History  Problem Relation Age of Onset  . Breast cancer Maternal Aunt   . Breast cancer Maternal Grandmother     Social History   Socioeconomic History  . Marital status: Married    Spouse name: Not on file  . Number of children: Not on file  . Years of education: Not on file  . Highest education level: Not on file  Occupational History  . Not on file  Social Needs  . Financial resource strain: Not on file  . Food insecurity:    Worry: Not on file    Inability: Not on file  . Transportation needs:    Medical: Not on file    Non-medical: Not on file  Tobacco Use  . Smoking status: Former Smoker    Packs/day: 1.00    Years: 30.00    Pack years: 30.00    Types:  Cigarettes    Last attempt to quit: 10/24/2016    Years since quitting: 1.3  . Smokeless tobacco: Never Used  Substance and Sexual Activity  . Alcohol use: No    Alcohol/week: 0.0 standard drinks  . Drug use: No  . Sexual activity: Yes  Lifestyle  . Physical activity:    Days per week: Not on file    Minutes per session: Not on file  . Stress: Not on file  Relationships  . Social connections:    Talks on phone: Not on file    Gets together: Not on file    Attends religious service: Not on file    Active member of club or organization: Not on file    Attends meetings of clubs or organizations: Not on file    Relationship status: Not on file  . Intimate partner violence:    Fear of current or ex  partner: Not on file    Emotionally abused: Not on file    Physically abused: Not on file    Forced sexual activity: Not on file  Other Topics Concern  . Not on file  Social History Narrative  . Not on file    Review of Systems: See HPI, otherwise negative ROS  Physical Exam: BP (!) 117/59   Pulse 81   Temp 97.7 F (36.5 C) (Temporal)   Ht 5\' 5"  (1.651 m)   Wt 78.9 kg   SpO2 100%   BMI 28.96 kg/m  General:   Alert,  pleasant and cooperative in NAD Head:  Normocephalic and atraumatic. Neck:  Supple; no masses or thyromegaly. Lungs:  Clear throughout to auscultation.    Heart:  Regular rate and rhythm. Abdomen:  Soft, nontender and nondistended. Normal bowel sounds, without guarding, and without rebound.   Neurologic:  Alert and  oriented x4;  grossly normal neurologically.  Impression/Plan: Sarah Pham is now here to undergo a screening colonoscopy.  Risks, benefits, and alternatives regarding colonoscopy have been reviewed with the patient.  Questions have been answered.  All parties agreeable.

## 2018-03-04 NOTE — Anesthesia Preprocedure Evaluation (Signed)
Anesthesia Evaluation  Patient identified by MRN, date of birth, ID band  Reviewed: NPO status   History of Anesthesia Complications Negative for: history of anesthetic complications  Airway Mallampati: II  TM Distance: >3 FB Neck ROM: full    Dental no notable dental hx.    Pulmonary sleep apnea and Continuous Positive Airway Pressure Ventilation , COPD (mild), Current Smoker,    Pulmonary exam normal        Cardiovascular Exercise Tolerance: Good negative cardio ROS Normal cardiovascular exam     Neuro/Psych negative neurological ROS  negative psych ROS   GI/Hepatic Neg liver ROS, GERD  Controlled,Gastric sleeve : 2018 > lost 100 lbs   Endo/Other  negative endocrine ROS  Renal/GU negative Renal ROS  negative genitourinary   Musculoskeletal Recent L shoulder surgery   Abdominal   Peds  Hematology negative hematology ROS (+)   Anesthesia Other Findings   Reproductive/Obstetrics                             Anesthesia Physical Anesthesia Plan  ASA: II  Anesthesia Plan: General   Post-op Pain Management:    Induction:   PONV Risk Score and Plan:   Airway Management Planned: Natural Airway  Additional Equipment:   Intra-op Plan:   Post-operative Plan:   Informed Consent: I have reviewed the patients History and Physical, chart, labs and discussed the procedure including the risks, benefits and alternatives for the proposed anesthesia with the patient or authorized representative who has indicated his/her understanding and acceptance.     Plan Discussed with: CRNA  Anesthesia Plan Comments:         Anesthesia Quick Evaluation

## 2018-03-04 NOTE — OR Nursing (Signed)
Patient reported recent rotator cuff surgery and she may wake up complaining of shoulder pain due to being on left side during procedure.

## 2018-03-04 NOTE — Anesthesia Procedure Notes (Signed)
Procedure Name: General with mask airway Performed by: Izetta Dakin, CRNA Pre-anesthesia Checklist: Patient identified, Emergency Drugs available, Suction available, Patient being monitored and Timeout performed Patient Re-evaluated:Patient Re-evaluated prior to induction Oxygen Delivery Method: Nasal cannula Preoxygenation: Pre-oxygenation with 100% oxygen Induction Type: IV induction

## 2018-03-07 ENCOUNTER — Encounter: Payer: Self-pay | Admitting: Gastroenterology

## 2018-03-08 LAB — SURGICAL PATHOLOGY

## 2018-03-10 ENCOUNTER — Encounter: Payer: Self-pay | Admitting: Gastroenterology

## 2018-03-14 ENCOUNTER — Encounter: Payer: Self-pay | Admitting: Gastroenterology

## 2018-03-14 NOTE — Addendum Note (Signed)
Addendum  created 03/14/18 1324 by Izetta Dakin, CRNA   Intraprocedure Event edited

## 2018-07-21 NOTE — Progress Notes (Signed)
Patient: Sarah Pham, Female    DOB: 1965/10/02, 53 y.o.   MRN: [id number] Visit Date: 07/22/2018  Today's Provider: Mar Daring, PA-C   Chief Complaint  Patient presents with  . Annual Exam   Subjective:     Annual physical exam Sarah Pham is a 53 y.o. female who presents today for health maintenance and complete physical. She feels well. She reports exercising. She reports she is sleeping well.  ----------------------------------------------------------------- PAP:06/05/2016-Negative Mammogram:006/07/2017-Normal Colonoscopy:03/04/2018 Repeat in 5 years-Tubular Adenoma  Review of Systems  Constitutional: Negative.   HENT: Negative.   Eyes: Negative.   Respiratory: Positive for apnea.   Cardiovascular: Negative.   Gastrointestinal: Negative.   Endocrine: Negative.   Genitourinary: Negative.   Musculoskeletal: Negative.   Skin: Negative.   Allergic/Immunologic: Positive for food allergies.  Neurological: Negative.   Hematological: Negative.   Psychiatric/Behavioral: Negative.     Social History      She  reports that she quit smoking about 20 months ago. Her smoking use included cigarettes. She has a 30.00 pack-year smoking history. She has never used smokeless tobacco. She reports that she does not drink alcohol or use drugs.       Social History   Socioeconomic History  . Marital status: Married    Spouse name: Not on file  . Number of children: Not on file  . Years of education: Not on file  . Highest education level: Not on file  Occupational History  . Not on file  Social Needs  . Financial resource strain: Not on file  . Food insecurity:    Worry: Not on file    Inability: Not on file  . Transportation needs:    Medical: Not on file    Non-medical: Not on file  Tobacco Use  . Smoking status: Former Smoker    Packs/day: 1.00    Years: 30.00    Pack years: 30.00    Types: Cigarettes    Last attempt to quit: 10/24/2016    Years  since quitting: 1.7  . Smokeless tobacco: Never Used  Substance and Sexual Activity  . Alcohol use: No    Alcohol/week: 0.0 standard drinks  . Drug use: No  . Sexual activity: Yes  Lifestyle  . Physical activity:    Days per week: Not on file    Minutes per session: Not on file  . Stress: Not on file  Relationships  . Social connections:    Talks on phone: Not on file    Gets together: Not on file    Attends religious service: Not on file    Active member of club or organization: Not on file    Attends meetings of clubs or organizations: Not on file    Relationship status: Not on file  Other Topics Concern  . Not on file  Social History Narrative  . Not on file    Past Medical History:  Diagnosis Date  . Hyperlipidemia   . Left arm weakness    due to recent RTR  . Obesity   . Sleep apnea    CPAP  . Wears contact lenses      Patient Active Problem List   Diagnosis Date Noted  . Benign neoplasm of transverse colon   . Status post bariatric surgery 06/11/2017  . 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 OSA on CPAP 06/11/2017  . BMI 33.0-33.9,adult 06/11/2017  . Viral warts 07/06/2014  . Familial multiple lipoprotein-type hyperlipidemia  07/06/2014  . Elevated blood pressure, situational 07/06/2014  . Carpal tunnel syndrome 07/06/2014  . Chronic obstructive pulmonary disease with acute lower resp infection ([place]) 07/06/2014  . Compulsive tobacco user syndrome 07/06/2014  . Extreme obesity 07/06/2014    Past Surgical History:  Procedure Laterality Date  . COLONOSCOPY WITH PROPOFOL N/A 03/04/2018   Procedure: COLONOSCOPY WITH BIOPSIES;  Surgeon: [name], MD;  Location: [place];  Service: Endoscopy;  Laterality: N/A;  sleep apnea  . FOOT SURGERY    . LAPAROSCOPIC GASTRIC SLEEVE RESECTION    . NASAL SINUS SURGERY    . OOPHORECTOMY Left    right was not removed  . POLYPECTOMY N/A 03/04/2018   Procedure: POLYPECTOMY;  Surgeon: [name], MD;   Location: [place];  Service: Endoscopy;  Laterality: N/A;  . ROTATOR CUFF REPAIR Left 12/31/2017   [facility] Dr Harlow Mares.  Marland Kitchen VAGINAL HYSTERECTOMY  1995    Family History        Family Status  Relation Name Status  . Mother  Alive  . Father  Alive  . Mat Aunt  (Not Specified)  . MGM  (Not Specified)        Her family history includes Breast cancer in her maternal aunt and maternal grandmother.      Allergies  Allergen Reactions  . Alpha-Gal Anaphylaxis  . Galactose Anaphylaxis  . Grape Seed Anaphylaxis  . Other Other (See Comments)    Tomatoes, dust and mold. 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 Sulfa Antibiotics Nausea And Vomiting  . Sulfasalazine Nausea And Vomiting  . Tomato Itching    Itchy throat     Current Outpatient Medications:  .  BIOTIN PO, Take by mouth., Disp: , Rfl:  .  EPINEPHrine 0.3 mg/0.3 mL IJ SOAJ injection, INJ 0.3 ML IM ONCE FOR 1 DOSE, Disp: , Rfl: 3 .  Multiple Vitamins-Minerals (BARIATRIC MULTIVITAMINS/IRON) CAPS, Take by mouth daily., Disp: , Rfl:  .  Spacer/Aero-Holding Chambers (AEROCHAMBER PLUS) inhaler, Use as instructed (Patient not taking: Reported on 02/28/2018), Disp: 1 each, Rfl: 2   Patient Care Team: Rubye Beach as PCP - General (Family Medicine)    Objective:    Vitals: BP 122/81 (BP Location: Left Arm, Patient Position: Sitting, Cuff Size: Large)   Pulse 80   Temp 98.8 F (37.1 C) (Oral)   Resp 16   Ht 5\' 5"  (1.651 m)   Wt 175 lb (79.4 kg)   BMI 29.12 kg/m    Vitals:   07/22/18 0907  BP: 122/81  Pulse: 80  Resp: 16  Temp: 98.8 F (37.1 C)  TempSrc: Oral  Weight: 175 lb (79.4 kg)  Height: 5\' 5"  (1.651 m)     Physical Exam Vitals signs reviewed.  Constitutional:      General: She is not in acute distress.    Appearance: Normal appearance. She is well-developed and normal weight. She is not ill-appearing or diaphoretic.  HENT:     Head: Normocephalic and  atraumatic.     Right Ear: Tympanic membrane, ear canal and external ear normal.     Left Ear: Tympanic membrane, ear canal and external ear normal.     Nose: Nose normal.     Mouth/Throat:     Mouth: Mucous membranes are moist.     Pharynx: Oropharynx is clear.  Eyes:     General: No scleral icterus.    Extraocular Movements: Extraocular movements intact.  Conjunctiva/sclera: Conjunctivae normal.     Pupils: Pupils are equal, round, and reactive to light.  Neck:     Musculoskeletal: Normal range of motion and neck supple.     Thyroid: No thyromegaly.     Vascular: No carotid bruit or JVD.     Trachea: No tracheal deviation.  Cardiovascular:     Rate and Rhythm: Normal rate and regular rhythm.     Pulses: Normal pulses.     Heart sounds: Normal heart sounds. No murmur. No friction rub. No gallop.   Pulmonary:     Effort: Pulmonary effort is normal. No respiratory distress.     Breath sounds: Normal breath sounds. No wheezing or rales.  Abdominal:     General: Bowel sounds are normal.  Musculoskeletal: Normal range of motion.     Right lower leg: No edema.     Left lower leg: No edema.  Lymphadenopathy:     Cervical: No cervical adenopathy.  Skin:    General: Skin is warm and dry.     Capillary Refill: Capillary refill takes less than 2 seconds.  Neurological:     General: No focal deficit present.     Mental Status: She is alert and oriented to person, place, and time. Mental status is at baseline.     Cranial Nerves: No cranial nerve deficit.  Psychiatric:        Mood and Affect: Mood normal.        Behavior: Behavior normal.        Thought Content: Thought content normal.        Judgment: Judgment normal.      Depression Screen PHQ 2/9 Scores 07/22/2018 07/15/2017  PHQ - 2 Score 0 0       Assessment & Plan:     Routine Health Maintenance and Physical Exam  Exercise Activities and Dietary recommendations Goals   None     Immunization History   Administered Date(s) Administered  . Influenza,inj,Quad PF,6+ Mos 12/22/2017  . Td 02/23/2005    Health Maintenance  Topic Date Due  . HIV Screening  02/20/1983  . TETANUS/TDAP  02/24/2015  . INFLUENZA VACCINE  09/24/2018  . PAP SMEAR-Modifier  06/06/2019  . MAMMOGRAM  07/30/2019  . COLONOSCOPY  03/05/2023     Discussed health benefits of physical activity, and encouraged her to engage in regular exercise appropriate for her age and condition.    1. Annual physical exam Normal physical exam today. Will check labs as below and f/u pending lab results. If labs are stable and WNL she will not need to have these rechecked for one year at her next annual physical exam. She is to call the office in the meantime if she has any acute issue, questions or concerns. - CBC with Differential/Platelet - Comprehensive metabolic panel - Hemoglobin A1c - Lipid panel - TSH  2. Breast cancer screening Breast exam today was normal. There is family history of breast cancer in maternal aunt and MGM. She does perform regular self breast exams. Mammogram was ordered as below. Information for [facility] clinic was given to patient so she may schedule her mammogram at her convenience. - MM 3D SCREEN BREAST BILATERAL; Future  3. Mixed simple and mucopurulent chronic bronchitis (HCC) Stable. Continue to not smoke.   4. 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 Rechecking panel to see if avoidance of these foods should continue. Keep epipen on person. - Alpha-Gal Panel  5. Compulsive tobacco user syndrome Quit 10/24/2016, 30  pack year smoking history.  6. Familial multiple lipoprotein-type hyperlipidemia Will check labs as below and f/u pending results. - Lipid panel  7. Status post bariatric surgery Will check labs as below and f/u pending results. - Vitamin A - Vitamin K1, Serum - Phosphorus  8. BMI 29.0-29.9,adult Continues to do well post bariatric surgery. Counseled  patient on healthy lifestyle modifications including dieting and exercise.   9. HIV screening declined Will check labs as below and f/u pending results. - HIV Antibody (routine testing w rflx)  10. Advice Given About Covid-19 Virus Infection Patient requesting. No symptoms. [employer] employee, testing is to be included with biometric screen. - SAR CoV2 Serology (COVID 19)AB(IGG)IA - SARS-CoV-2 Antibody, IgM - SARS-CoV-2 Antibody, IgA  11. Need for Tdap vaccination Tdap Vaccine given to patient without complications. Patient sat for 15 minutes after administration and was tolerated well without adverse effects. - Tdap vaccine greater than or equal to 7yo IM  12. Rash Multiple tick bites on right flank. One with area of erythema greater than 1cm in diameter. Will treat with doxycycline 100mg  BID x 7 days.  Call if worsening.   13. Tick bite, initial encounter See above medical treatment plan.  --------------------------------------------------------------------    Mar Daring, PA-C  [place]

## 2018-07-22 ENCOUNTER — Ambulatory Visit (INDEPENDENT_AMBULATORY_CARE_PROVIDER_SITE_OTHER): Payer: Managed Care, Other (non HMO) | Admitting: Physician Assistant

## 2018-07-22 ENCOUNTER — Encounter: Payer: Self-pay | Admitting: Physician Assistant

## 2018-07-22 ENCOUNTER — Other Ambulatory Visit: Payer: Self-pay

## 2018-07-22 VITALS — BP 122/81 | HR 80 | Temp 98.8°F | Resp 16 | Ht 65.0 in | Wt 175.0 lb

## 2018-07-22 DIAGNOSIS — Z7189 Other specified counseling: Secondary | ICD-10-CM

## 2018-07-22 DIAGNOSIS — Z0000 Encounter for general adult medical examination without abnormal findings: Secondary | ICD-10-CM | POA: Diagnosis not present

## 2018-07-22 DIAGNOSIS — E7849 Other hyperlipidemia: Secondary | ICD-10-CM

## 2018-07-22 DIAGNOSIS — E8809 Other disorders of plasma-protein metabolism, not elsewhere classified: Secondary | ICD-10-CM

## 2018-07-22 DIAGNOSIS — R21 Rash and other nonspecific skin eruption: Secondary | ICD-10-CM

## 2018-07-22 DIAGNOSIS — Z6829 Body mass index (BMI) 29.0-29.9, adult: Secondary | ICD-10-CM

## 2018-07-22 DIAGNOSIS — E756 Lipid storage disorder, unspecified: Secondary | ICD-10-CM | POA: Diagnosis not present

## 2018-07-22 DIAGNOSIS — W57XXXA Bitten or stung by nonvenomous insect and other nonvenomous arthropods, initial encounter: Secondary | ICD-10-CM | POA: Diagnosis not present

## 2018-07-22 DIAGNOSIS — Z23 Encounter for immunization: Secondary | ICD-10-CM | POA: Diagnosis not present

## 2018-07-22 DIAGNOSIS — F17200 Nicotine dependence, unspecified, uncomplicated: Secondary | ICD-10-CM

## 2018-07-22 DIAGNOSIS — Z1239 Encounter for other screening for malignant neoplasm of breast: Secondary | ICD-10-CM

## 2018-07-22 DIAGNOSIS — J418 Mixed simple and mucopurulent chronic bronchitis: Secondary | ICD-10-CM | POA: Diagnosis not present

## 2018-07-22 DIAGNOSIS — Z9884 Bariatric surgery status: Secondary | ICD-10-CM

## 2018-07-22 DIAGNOSIS — Z5320 Procedure and treatment not carried out because of patient's decision for unspecified reasons: Secondary | ICD-10-CM

## 2018-07-22 MED ORDER — DOXYCYCLINE HYCLATE 100 MG PO TABS: 100.0000 mg | ORAL_TABLET | Freq: Two times a day (BID) | ORAL | 0 refills | Status: DC

## 2018-07-22 NOTE — Patient Instructions (Signed)

## 2018-07-29 LAB — HEMOGLOBIN A1C
Est. average glucose Bld gHb Est-mCnc: 105 mg/dL
Hgb A1c MFr Bld: 5.3 % (ref 4.8–5.6)

## 2018-07-29 LAB — VITAMIN K1, SERUM: VITAMIN K1: 1.07 ng/mL (ref 0.13–1.88)

## 2018-07-29 LAB — COMPREHENSIVE METABOLIC PANEL
ALT: 18 IU/L (ref 0–32)
AST: 20 IU/L (ref 0–40)
Albumin/Globulin Ratio: 1.8 (ref 1.2–2.2)
Albumin: 4.6 g/dL (ref 3.8–4.8)
Alkaline Phosphatase: 76 IU/L (ref 39–117)
BUN/Creatinine Ratio: 31 — ABNORMAL HIGH (ref 9–23)
BUN: 20 mg/dL (ref 6–24)
Bilirubin Total: 0.3 mg/dL (ref 0.0–1.2)
CO2: 24 mmol/L (ref 20–29)
Calcium: 10.1 mg/dL (ref 8.7–10.2)
Chloride: 98 mmol/L (ref 96–106)
Creatinine, Ser: 0.65 mg/dL (ref 0.57–1.00)
GFR calc Af Amer: 120 mL/min/{1.73_m2} (ref >59)
GFR calc non Af Amer: 104 mL/min/{1.73_m2} (ref >59)
Globulin, Total: 2.6 g/dL (ref 1.5–4.5)
Glucose: 77 mg/dL (ref 65–99)
Potassium: 4 mmol/L (ref 3.5–5.2)
Sodium: 140 mmol/L (ref 134–144)
Total Protein: 7.2 g/dL (ref 6.0–8.5)

## 2018-07-29 LAB — CBC WITH DIFFERENTIAL/PLATELET
Basophils Absolute: 0.1 10*3/uL (ref 0.0–0.2)
Basos: 0 %
EOS (ABSOLUTE): 0.3 10*3/uL (ref 0.0–0.4)
Eos: 2 %
Hematocrit: 48.3 % — ABNORMAL HIGH (ref 34.0–46.6)
Hemoglobin: 16.7 g/dL — ABNORMAL HIGH (ref 11.1–15.9)
Immature Grans (Abs): 0 10*3/uL (ref 0.0–0.1)
Immature Granulocytes: 0 %
Lymphocytes Absolute: 3.7 10*3/uL — ABNORMAL HIGH (ref 0.7–3.1)
Lymphs: 26 %
MCH: 29.7 pg (ref 26.6–33.0)
MCHC: 34.6 g/dL (ref 31.5–35.7)
MCV: 86 fL (ref 79–97)
Monocytes Absolute: 0.8 10*3/uL (ref 0.1–0.9)
Monocytes: 6 %
Neutrophils Absolute: 9.2 10*3/uL — ABNORMAL HIGH (ref 1.4–7.0)
Neutrophils: 66 %
Platelets: 253 10*3/uL (ref 150–450)
RBC: 5.62 x10E6/uL — ABNORMAL HIGH (ref 3.77–5.28)
RDW: 12.7 % (ref 11.7–15.4)
WBC: 14.1 10*3/uL — ABNORMAL HIGH (ref 3.4–10.8)

## 2018-07-29 LAB — LIPID PANEL
Chol/HDL Ratio: 5.7 ratio — ABNORMAL HIGH (ref 0.0–4.4)
Cholesterol, Total: 243 mg/dL — ABNORMAL HIGH (ref 100–199)
HDL: 43 mg/dL (ref >39)
LDL Calculated: 183 mg/dL — ABNORMAL HIGH (ref 0–99)
Triglycerides: 84 mg/dL (ref 0–149)
VLDL Cholesterol Cal: 17 mg/dL (ref 5–40)

## 2018-07-29 LAB — VITAMIN A: Vitamin A: 39.4 ug/dL (ref 20.1–62.0)

## 2018-07-29 LAB — SARS-COV-2 ANTIBODY, IGM: SARS-CoV-2 Antibody, IgM: NEGATIVE

## 2018-07-29 LAB — PHOSPHORUS: Phosphorus: 3.7 mg/dL (ref 3.0–4.3)

## 2018-07-29 LAB — ALPHA-GAL PANEL
Alpha Gal IgE*: 14.6 kU/L — ABNORMAL HIGH (ref <0.10)
Beef (Bos spp) IgE: 4.83 kU/L — ABNORMAL HIGH (ref <0.35)
Class Interpretation: 2
Class Interpretation: 2
Class Interpretation: 3
Lamb/Mutton (Ovis spp) IgE: 0.77 kU/L — ABNORMAL HIGH (ref <0.35)
Pork (Sus spp) IgE: 1.87 kU/L — ABNORMAL HIGH (ref <0.35)

## 2018-07-29 LAB — SAR COV2 SEROLOGY (COVID19)AB(IGG),IA: SARS-CoV-2 Ab, IgG: NEGATIVE

## 2018-07-29 LAB — TSH: TSH: 1.04 u[IU]/mL (ref 0.450–4.500)

## 2018-07-29 LAB — HIV ANTIBODY (ROUTINE TESTING W REFLEX): HIV Screen 4th Generation wRfx: NONREACTIVE

## 2018-07-29 LAB — SARS-COV-2 ANTIBODY, IGA: SARS-CoV-2 Antibody, IgA: NEGATIVE

## 2018-08-01 ENCOUNTER — Telehealth: Payer: Self-pay | Admitting: Physician Assistant

## 2018-08-01 DIAGNOSIS — E7800 Pure hypercholesterolemia, unspecified: Secondary | ICD-10-CM

## 2018-08-01 DIAGNOSIS — D72829 Elevated white blood cell count, unspecified: Secondary | ICD-10-CM

## 2018-08-01 DIAGNOSIS — D582 Other hemoglobinopathies: Secondary | ICD-10-CM

## 2018-08-01 MED ORDER — ATORVASTATIN CALCIUM 10 MG PO TABS: 10.0000 mg | ORAL_TABLET | Freq: Every day | ORAL | 3 refills | Status: DC

## 2018-08-01 NOTE — Telephone Encounter (Signed)
-----   Message from April M Miller, [place] sent at 08/01/2018  9:23 AM EDT ----- Patient was notified of results. Expressed understanding. Patient is agreeable to Hematology referral and also to statin rx. She uses [facility] in [place].

## 2018-08-01 NOTE — Telephone Encounter (Signed)
Referral placed to hematology and atorvastatin sent to [facility] [place]

## 2018-08-05 ENCOUNTER — Ambulatory Visit
Admission: RE | Admit: 2018-08-05 | Discharge: 2018-08-05 | Disposition: A | Discharge status: Home / Self Care | Payer: Managed Care, Other (non HMO) | Source: Ambulatory Visit | Attending: Physician Assistant | Admitting: Physician Assistant

## 2018-08-05 ENCOUNTER — Other Ambulatory Visit: Payer: Self-pay

## 2018-08-05 DIAGNOSIS — Z1231 Encounter for screening mammogram for malignant neoplasm of breast: Secondary | ICD-10-CM | POA: Diagnosis not present

## 2018-08-05 DIAGNOSIS — Z1239 Encounter for other screening for malignant neoplasm of breast: Secondary | ICD-10-CM | POA: Diagnosis present

## 2018-08-08 ENCOUNTER — Telehealth: Payer: Self-pay | Admitting: *Deleted

## 2018-08-08 ENCOUNTER — Encounter: Payer: Self-pay | Admitting: Oncology

## 2018-08-08 ENCOUNTER — Inpatient Hospital Stay: Payer: Managed Care, Other (non HMO) | Attending: Oncology | Admitting: Oncology

## 2018-08-08 DIAGNOSIS — F17210 Nicotine dependence, cigarettes, uncomplicated: Secondary | ICD-10-CM

## 2018-08-08 DIAGNOSIS — D729 Disorder of white blood cells, unspecified: Secondary | ICD-10-CM

## 2018-08-08 DIAGNOSIS — D72820 Lymphocytosis (symptomatic): Secondary | ICD-10-CM

## 2018-08-08 NOTE — Progress Notes (Signed)
I connected with Sarah Pham on 08/08/18 at  1:30 PM EDT by video enabled telemedicine visit and verified that I am speaking with the correct person using two identifiers.   I discussed the limitations, risks, security and privacy concerns of performing an evaluation and management service by telemedicine and the availability of in-person appointments. I also discussed with the patient that there may be a patient responsible charge related to this service. The patient expressed understanding and agreed to proceed.  Other persons participating in the visit and their role in the encounter:  none  Patient's location:  home Provider's location:  home  Reason for referral: Leukocytosis Referring provider Lillia Abed, PA  History of present illness: Patient is a 53 year old female with a past medical history significant for hyperlipidemia, obstructive sleep apnea as well as she is a current smoker.  She has been referred to [place] for leukocytosis.Most recent CBC from 07/22/2018 showed a white count of 14.1, H&H of 16.7/48.3 and a platelet count of 253.  Differential on the CBC showed mainly neutrophilia with an elevated absolute neutrophil count of 9.2 and some lymphocytosis. Patient denies any symptoms of unintentional weight loss, drenching night sweats or lumps or bumps anywhere.  She has some mild fatigue.   Review of Systems  Constitutional: Positive for malaise/fatigue. Negative for chills, fever and weight loss.  HENT: Negative for congestion, ear discharge and nosebleeds.   Eyes: Negative for blurred vision.  Respiratory: Negative for cough, hemoptysis, sputum production, shortness of breath and wheezing.   Cardiovascular: Negative for chest pain, palpitations, orthopnea and claudication.  Gastrointestinal: Negative for abdominal pain, blood in stool, constipation, diarrhea, heartburn, melena, nausea and vomiting.  Genitourinary: Negative for dysuria, flank pain, frequency, hematuria and  urgency.  Musculoskeletal: Negative for back pain, joint pain and myalgias.  Skin: Negative for rash.  Neurological: Negative for dizziness, tingling, focal weakness, seizures, weakness and headaches.  Endo/Heme/Allergies: Does not bruise/bleed easily.  Psychiatric/Behavioral: Negative for depression and suicidal ideas. The patient does not have insomnia.     Allergies  Allergen Reactions  . Alpha-Gal Anaphylaxis  . Galactose Anaphylaxis  . Grape Seed Anaphylaxis  . Other Other (See Comments)    Tomatoes, dust and mold. 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 Sulfa Antibiotics Nausea And Vomiting  . Sulfasalazine Nausea And Vomiting  . Tomato Itching    Itchy throat    Past Medical History:  Diagnosis Date  . Hyperlipidemia   . Left arm weakness    due to recent RTR  . Obesity   . Sleep apnea    CPAP  . Tick bite    this caused the patient not to be able to eat meat any longer  . Wears contact lenses     Past Surgical History:  Procedure Laterality Date  . COLONOSCOPY WITH PROPOFOL N/A 03/04/2018   Procedure: COLONOSCOPY WITH BIOPSIES;  Surgeon: [name], MD;  Location: [place];  Service: Endoscopy;  Laterality: N/A;  sleep apnea  . FOOT SURGERY    . LAPAROSCOPIC GASTRIC SLEEVE RESECTION    . NASAL SINUS SURGERY    . OOPHORECTOMY Left    right was not removed  . POLYPECTOMY N/A 03/04/2018   Procedure: POLYPECTOMY;  Surgeon: [name], MD;  Location: [place];  Service: Endoscopy;  Laterality: N/A;  . ROTATOR CUFF REPAIR Left 12/31/2017   [facility] Dr Harlow Mares.  Marland Kitchen [place]    Social History  Socioeconomic History  . Marital status: Married    Spouse name: Not on file  . Number of children: Not on file  . Years of education: Not on file  . Highest education level: Not on file  Occupational History  . Not on file  Social Needs  . Financial resource strain: Not on file  . Food insecurity     Worry: Not on file    Inability: Not on file  . Transportation needs    Medical: Not on file    Non-medical: Not on file  Tobacco Use  . Smoking status: Current Some Day Smoker    Packs/day: 1.00    Years: 30.00    Pack years: 30.00    Types: Cigarettes    Last attempt to quit: 10/24/2016    Years since quitting: 1.7  . Smokeless tobacco: Never Used  . Tobacco comment: 15 cig in a week  Substance and Sexual Activity  . Alcohol use: No    Alcohol/week: 0.0 standard drinks  . Drug use: No  . Sexual activity: Yes  Lifestyle  . Physical activity    Days per week: Not on file    Minutes per session: Not on file  . Stress: Not on file  Relationships  . Social Herbalist on phone: Not on file    Gets together: Not on file    Attends religious service: Not on file    Active member of club or organization: Not on file    Attends meetings of clubs or organizations: Not on file    Relationship status: Not on file  . Intimate partner violence    Fear of current or ex partner: Not on file    Emotionally abused: Not on file    Physically abused: Not on file    Forced sexual activity: Not on file  Other Topics Concern  . Not on file  Social History Narrative  . Not on file    Family History  Problem Relation Age of Onset  . Breast cancer Maternal Aunt   . Breast cancer Maternal Grandmother      Current Outpatient Medications:  .  atorvastatin (LIPITOR) 10 MG tablet, Take 1 tablet (10 mg total) by mouth daily., Disp: 90 tablet, Rfl: 3 .  BIOTIN PO, Take 2 Doses/Fill by mouth daily. , Disp: , Rfl:  .  EPINEPHrine 0.3 mg/0.3 mL IJ SOAJ injection, INJ 0.3 ML IM ONCE FOR 1 DOSE, Disp: , Rfl: 3 .  Multiple Vitamins-Minerals (BARIATRIC MULTIVITAMINS/IRON) CAPS, Take 1 tablet by mouth daily. , Disp: , Rfl:   Mm 3d Screen Breast Bilateral  Result Date: 08/05/2018 CLINICAL DATA:  Screening. EXAM: DIGITAL SCREENING BILATERAL MAMMOGRAM WITH TOMO AND CAD COMPARISON:  Previous  exam(s). ACR Breast Density Category b: There are scattered areas of fibroglandular density. FINDINGS: There are no findings suspicious for malignancy. Images were processed with CAD. IMPRESSION: No mammographic evidence of malignancy. A result letter of this screening mammogram will be mailed directly to the patient. RECOMMENDATION: Screening mammogram in one year. (Code:SM-B-01Y) BI-RADS CATEGORY  1: Negative. Electronically Signed   By: Lovey Newcomer M.D.   On: 08/05/2018 14:25    No images are attached to the encounter.   CMP Latest Ref Rng & Units 07/22/2018  Glucose 65 - 99 mg/dL 77  BUN 6 - 24 mg/dL 20  Creatinine 0.57 - 1.00 mg/dL 0.65  Sodium 134 - 144 mmol/L 140  Potassium 3.5 - 5.2 mmol/L 4.0  Chloride 96 - 106 mmol/L 98  CO2 20 - 29 mmol/L 24  Calcium 8.7 - 10.2 mg/dL 10.1  Total Protein 6.0 - 8.5 g/dL 7.2  Total Bilirubin 0.0 - 1.2 mg/dL 0.3  Alkaline Phos 39 - 117 IU/L 76  AST 0 - 40 IU/L 20  ALT 0 - 32 IU/L 18   CBC Latest Ref Rng & Units 07/22/2018  WBC 3.4 - 10.8 x10E3/uL 14.1(H)  Hemoglobin 11.1 - 15.9 g/dL 16.7(H)  Hematocrit 34.0 - 46.6 % 48.3(H)  Platelets 150 - 450 x10E3/uL 253     Observation/objective: Appears in no acute distress or video visit today.  Breathing is nonlabored  Assessment and plan: Patient is a 53 year old female referred for leukocytosis mainly neutrophilia and lymphocytosis likely secondary to smoking  I will plan to check a CBC with differential, flow cytometry, BCR ABL fish testing which will be done at [facility].  Patient also has mild polycythemia which I suspect is secondary due to her smoking.  Follow-up instructions: Labs to be done at [facility].  Video visit in 1 to 2 weeks  I discussed the assessment and treatment plan with the patient. The patient was provided an opportunity to ask questions and all were answered. The patient agreed with the plan and demonstrated an understanding of the instructions.   The patient was advised to  call back or seek an in-person evaluation if the symptoms worsen or if the condition fails to improve as anticipated.    Visit Diagnosis: 1. Neutrophilia   2. Lymphocytosis     Dr. Randa Evens, MD, MPH [facility] at [facility] [phone] 08/08/2018 2:31 PM

## 2018-08-08 NOTE — Telephone Encounter (Signed)
Called and spoke to husband , was told by patient to call him and he can come pick up [facility] form for her labs. He said he would be here in a few min. And I took it downstairs and gave him instructions

## 2018-08-16 ENCOUNTER — Encounter: Payer: Self-pay | Admitting: Oncology

## 2018-08-22 ENCOUNTER — Encounter: Payer: Self-pay | Admitting: Oncology

## 2018-08-22 ENCOUNTER — Inpatient Hospital Stay (HOSPITAL_BASED_OUTPATIENT_CLINIC_OR_DEPARTMENT_OTHER): Payer: Managed Care, Other (non HMO) | Admitting: Oncology

## 2018-08-22 VITALS — Wt 174.0 lb

## 2018-08-22 DIAGNOSIS — D729 Disorder of white blood cells, unspecified: Secondary | ICD-10-CM | POA: Diagnosis not present

## 2018-08-22 DIAGNOSIS — F17210 Nicotine dependence, cigarettes, uncomplicated: Secondary | ICD-10-CM

## 2018-08-22 DIAGNOSIS — D751 Secondary polycythemia: Secondary | ICD-10-CM

## 2018-08-22 NOTE — Progress Notes (Signed)
Patient checked in for virtual visit. She is working from home. Smoking again but less than previously. Feels well but very anxious for test results today.

## 2018-08-22 NOTE — Progress Notes (Signed)
I connected with Sarah Pham on 08/22/18 at 10:15 AM EDT by video enabled telemedicine visit and verified that I am speaking with the correct person using two identifiers.   I discussed the limitations, risks, security and privacy concerns of performing an evaluation and management service by telemedicine and the availability of in-person appointments. I also discussed with the patient that there may be a patient responsible charge related to this service. The patient expressed understanding and agreed to proceed.  There were problems with video connection and visit had to be switched to telephone.   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 Leucocytosis/ neutrophilia due to smoking  History of present illness: Patient is a 53 year old female with a past medical history significant for hyperlipidemia, obstructive sleep apnea as well as she is a current smoker.  She has been referred to [place] for leukocytosis.Most recent CBC from 07/22/2018 showed a white count of 14.1, H&H of 16.7/48.3 and a platelet count of 253.  Differential on the CBC showed mainly neutrophilia with an elevated absolute neutrophil count of 9.2 and some lymphocytosis.  Interval history: overall feels well. Continues to smoke. Denies any complaints at this time   Review of Systems  Constitutional: Negative for chills, fever, malaise/fatigue and weight loss.  HENT: Negative for congestion, ear discharge and nosebleeds.   Eyes: Negative for blurred vision.  Respiratory: Negative for cough, hemoptysis, sputum production, shortness of breath and wheezing.   Cardiovascular: Negative for chest pain, palpitations, orthopnea and claudication.  Gastrointestinal: Negative for abdominal pain, blood in stool, constipation, diarrhea,  heartburn, melena, nausea and vomiting.  Genitourinary: Negative for dysuria, flank pain, frequency, hematuria and urgency.  Musculoskeletal: Negative for back pain, joint pain and myalgias.  Skin: Negative for rash.  Neurological: Negative for dizziness, tingling, focal weakness, seizures, weakness and headaches.  Endo/Heme/Allergies: Does not bruise/bleed easily.  Psychiatric/Behavioral: Negative for depression and suicidal ideas. The patient does not have insomnia.     Allergies  Allergen Reactions  . Alpha-Gal Anaphylaxis  . Galactose Anaphylaxis  . Grape Seed Anaphylaxis  . Other Other (See Comments)    Tomatoes, dust and mold. 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 Sulfa Antibiotics Nausea And Vomiting  . Sulfasalazine Nausea And Vomiting  . Tomato Itching    Itchy throat    Past Medical History:  Diagnosis Date  . Hyperlipidemia   . Left arm weakness    due to recent RTR  . Obesity   . Sleep apnea    CPAP  . Tick bite    this caused the patient not to be able to eat meat any longer  . Wears contact lenses     Past Surgical History:  Procedure Laterality Date  . COLONOSCOPY WITH PROPOFOL N/A 03/04/2018   Procedure: COLONOSCOPY WITH BIOPSIES;  Surgeon: [name], MD;  Location: [place];  Service: Endoscopy;  Laterality: N/A;  sleep apnea  . FOOT SURGERY    . LAPAROSCOPIC GASTRIC SLEEVE RESECTION    . NASAL SINUS SURGERY    . OOPHORECTOMY Left    right was not removed  . POLYPECTOMY N/A 03/04/2018   Procedure: POLYPECTOMY;  Surgeon: [name], MD;  Location: [place];  Service: Endoscopy;  Laterality: N/A;  . ROTATOR CUFF REPAIR Left 12/31/2017  [place]. Dr Harlow Mares.  Marland Kitchen VAGINAL HYSTERECTOMY  1995    Social History   Socioeconomic History  . Marital status: Married    Spouse name: Not on file  . Number of children: Not on file  . Years of education: Not on file  . Highest education level: Not on file   Occupational History  . Not on file  Social Needs  . Financial resource strain: Not on file  . Food insecurity    Worry: Not on file    Inability: Not on file  . Transportation needs    Medical: Not on file    Non-medical: Not on file  Tobacco Use  . Smoking status: Current Some Day Smoker    Packs/day: 1.00    Years: 30.00    Pack years: 30.00    Types: Cigarettes    Last attempt to quit: 10/24/2016    Years since quitting: 1.8  . Smokeless tobacco: Never Used  . Tobacco comment: 15 cig in a week  Substance and Sexual Activity  . Alcohol use: No    Alcohol/week: 0.0 standard drinks  . Drug use: No  . Sexual activity: Yes  Lifestyle  . Physical activity    Days per week: Not on file    Minutes per session: Not on file  . Stress: Not on file  Relationships  . Social Herbalist on phone: Not on file    Gets together: Not on file    Attends religious service: Not on file    Active member of club or organization: Not on file    Attends meetings of clubs or organizations: Not on file    Relationship status: Not on file  . Intimate partner violence    Fear of current or ex partner: Not on file    Emotionally abused: Not on file    Physically abused: Not on file    Forced sexual activity: Not on file  Other Topics Concern  . Not on file  Social History Narrative  . Not on file    Family History  Problem Relation Age of Onset  . Breast cancer Maternal Aunt   . Breast cancer Maternal Grandmother      Current Outpatient Medications:  .  atorvastatin (LIPITOR) 10 MG tablet, Take 1 tablet (10 mg total) by mouth daily., Disp: 90 tablet, Rfl: 3 .  BIOTIN PO, Take 2 Doses/Fill by mouth daily. , Disp: , Rfl:  .  EPINEPHrine 0.3 mg/0.3 mL IJ SOAJ injection, INJ 0.3 ML IM ONCE FOR 1 DOSE, Disp: , Rfl: 3 .  Multiple Vitamins-Minerals (BARIATRIC MULTIVITAMINS/IRON) CAPS, Take 1 tablet by mouth daily. , Disp: , Rfl:   Mm 3d Screen Breast Bilateral  Result Date:  08/05/2018 CLINICAL DATA:  Screening. EXAM: DIGITAL SCREENING BILATERAL MAMMOGRAM WITH TOMO AND CAD COMPARISON:  Previous exam(s). ACR Breast Density Category b: There are scattered areas of fibroglandular density. FINDINGS: There are no findings suspicious for malignancy. Images were processed with CAD. IMPRESSION: No mammographic evidence of malignancy. A result letter of this screening mammogram will be mailed directly to the patient. RECOMMENDATION: Screening mammogram in one year. (Code:SM-B-01Y) BI-RADS CATEGORY  1: Negative. Electronically Signed   By: Lovey Newcomer M.D.   On: 08/05/2018 14:25    No images are attached to the encounter.   CMP Latest Ref Rng & Units 07/22/2018  Glucose 65 - 99 mg/dL 77  BUN 6 - 24 mg/dL 20  Creatinine 0.57 -  1.00 mg/dL 0.65  Sodium 134 - 144 mmol/L 140  Potassium 3.5 - 5.2 mmol/L 4.0  Chloride 96 - 106 mmol/L 98  CO2 20 - 29 mmol/L 24  Calcium 8.7 - 10.2 mg/dL 10.1  Total Protein 6.0 - 8.5 g/dL 7.2  Total Bilirubin 0.0 - 1.2 mg/dL 0.3  Alkaline Phos 39 - 117 IU/L 76  AST 0 - 40 IU/L 20  ALT 0 - 32 IU/L 18   CBC Latest Ref Rng & Units 07/22/2018  WBC 3.4 - 10.8 x10E3/uL 14.1(H)  Hemoglobin 11.1 - 15.9 g/dL 16.7(H)  Hematocrit 34.0 - 46.6 % 48.3(H)  Platelets 150 - 450 x10E3/uL 253     Observation/objective:  Assessment and plan: Patient is a 53 year old female referred for leukocytosis and is here to discuss the results of blood work  I have reviewed labs done at [facility] which showed White count of 13.5, H&H of 17.9/52.8 and a platelet count of 285.  Differential mainly showed neutrophilia with an absolute neutrophil count of 9.6.  BCR ABL and peripheral flow cytometry was unremarkable.  I suspect her neutrophilia as well as polycythemia is secondary to smoking.  At this time I would like to continue to monitor her polycythemia and consider a phlebotomy for hematocrit is greater than 55.  I will also check EPO level and Jak 2 mutation testing with  her next set of labs in 3 months  Follow-up instructions: CBC with differential, EPO and Jak 2 mutation testing to be done at [facility] in 3 months.  I will see her back in 6 months with a CBC with differential  I discussed the assessment and treatment plan with the patient. The patient was provided an opportunity to ask questions and all were answered. The patient agreed with the plan and demonstrated an understanding of the instructions.   The patient was advised to call back or seek an in-person evaluation if the symptoms worsen or if the condition fails to improve as anticipated.   Visit Diagnosis: 1. Polycythemia, secondary   2. Neutrophilia     Dr. Randa Evens, MD, MPH [facility] [phone] 08/22/2018 10:17 AM

## 2018-08-31 ENCOUNTER — Encounter: Payer: Self-pay | Admitting: Oncology

## 2018-11-21 ENCOUNTER — Encounter: Payer: Self-pay | Admitting: Nurse Practitioner

## 2018-11-24 ENCOUNTER — Encounter: Payer: Self-pay | Admitting: Nurse Practitioner

## 2018-12-15 ENCOUNTER — Encounter: Payer: Self-pay | Admitting: Physician Assistant

## 2019-02-20 ENCOUNTER — Other Ambulatory Visit: Payer: Self-pay

## 2019-02-20 NOTE — Progress Notes (Signed)
Patient pre screened for office appointment, no questions or concerns today. Patient reminded of upcoming appointment time and date. 

## 2019-02-21 ENCOUNTER — Encounter: Payer: Self-pay | Admitting: Nurse Practitioner

## 2019-02-21 ENCOUNTER — Inpatient Hospital Stay: Payer: Managed Care, Other (non HMO) | Attending: Oncology | Admitting: Oncology

## 2019-02-21 ENCOUNTER — Other Ambulatory Visit: Payer: Self-pay

## 2019-02-21 VITALS — BP 128/80 | HR 70 | Temp 97.4°F | Resp 16 | Wt 184.1 lb

## 2019-02-21 DIAGNOSIS — D751 Secondary polycythemia: Secondary | ICD-10-CM

## 2019-02-21 DIAGNOSIS — G4733 Obstructive sleep apnea (adult) (pediatric): Secondary | ICD-10-CM | POA: Diagnosis not present

## 2019-02-21 DIAGNOSIS — Z882 Allergy status to sulfonamides status: Secondary | ICD-10-CM | POA: Insufficient documentation

## 2019-02-21 DIAGNOSIS — Z803 Family history of malignant neoplasm of breast: Secondary | ICD-10-CM | POA: Insufficient documentation

## 2019-02-21 DIAGNOSIS — Z79899 Other long term (current) drug therapy: Secondary | ICD-10-CM | POA: Diagnosis not present

## 2019-02-21 DIAGNOSIS — D72829 Elevated white blood cell count, unspecified: Secondary | ICD-10-CM

## 2019-02-21 DIAGNOSIS — E785 Hyperlipidemia, unspecified: Secondary | ICD-10-CM | POA: Diagnosis not present

## 2019-02-21 DIAGNOSIS — R5383 Other fatigue: Secondary | ICD-10-CM | POA: Diagnosis not present

## 2019-02-21 DIAGNOSIS — Z90721 Acquired absence of ovaries, unilateral: Secondary | ICD-10-CM | POA: Insufficient documentation

## 2019-02-21 DIAGNOSIS — F17210 Nicotine dependence, cigarettes, uncomplicated: Secondary | ICD-10-CM | POA: Diagnosis not present

## 2019-02-21 NOTE — Progress Notes (Signed)
Pt is fatigued all the time. Sleep 2 hours and wakes up. Hard time to go back to sleep. She works for [employer] and labs done there. She does have sleep apnea and wears cpap, she had gastric sleeve 2 years ago

## 2019-02-26 ENCOUNTER — Other Ambulatory Visit: Payer: Self-pay

## 2019-02-26 ENCOUNTER — Encounter: Payer: Self-pay | Admitting: Emergency Medicine

## 2019-02-26 ENCOUNTER — Encounter: Payer: Self-pay | Admitting: Oncology

## 2019-02-26 ENCOUNTER — Ambulatory Visit
Admission: EM | Admit: 2019-02-26 | Discharge: 2019-02-26 | Disposition: A | Discharge status: Home / Self Care | Payer: Managed Care, Other (non HMO) | Attending: Family Medicine | Admitting: Family Medicine

## 2019-02-26 DIAGNOSIS — Z20822 Contact with and (suspected) exposure to covid-19: Secondary | ICD-10-CM

## 2019-02-26 DIAGNOSIS — R5383 Other fatigue: Secondary | ICD-10-CM

## 2019-02-26 DIAGNOSIS — R519 Headache, unspecified: Secondary | ICD-10-CM

## 2019-02-26 DIAGNOSIS — R0981 Nasal congestion: Secondary | ICD-10-CM

## 2019-02-26 DIAGNOSIS — R05 Cough: Secondary | ICD-10-CM | POA: Diagnosis not present

## 2019-02-26 DIAGNOSIS — Z20828 Contact with and (suspected) exposure to other viral communicable diseases: Secondary | ICD-10-CM

## 2019-02-26 DIAGNOSIS — F17210 Nicotine dependence, cigarettes, uncomplicated: Secondary | ICD-10-CM

## 2019-02-26 MED ORDER — BUTALBITAL-APAP-CAFFEINE 50-325-40 MG PO TABS: 1.0000 | ORAL_TABLET | Freq: Four times a day (QID) | ORAL | 0 refills | Status: DC | PRN

## 2019-02-26 NOTE — Discharge Instructions (Signed)
Rest.  Fluids.  Medication as needed for headache.  If you worsen, go to the hospital.  Results available in 24-48 hours.  Dr. Lacinda Axon

## 2019-02-26 NOTE — ED Triage Notes (Signed)
Patient c/o cough, congestion, stuffy nose, headache and fatigue that started on Thursday.  Patient denies recent fever.

## 2019-02-26 NOTE — Progress Notes (Signed)
Hematology/Oncology Consult note [facility]  [phone] [phone]  Patient Care Team: Rubye Beach as PCP - General (Family Medicine)   Name of the patient: Sarah Pham  [id number]  10-18-1965   Date of visit: 02/26/19  Diagnosis-secondary polycythemia and leukocytosis likely secondary to smoking  Chief complaint/ Reason for visit-routine follow-up of leukocytosis and polycythemia  Heme/Onc history: : Patient is a 54 year old female with a past medical history significant for hyperlipidemia, obstructive sleep apnea as well as she is a current smoker. She has been referred to [place] for leukocytosis.Most recent CBC from 07/22/2018 showed a white count of 14.1, H&H of 16.7/48.3 and a platelet count of 253. Differential on the CBC showed mainly neutrophilia with an elevated absolute neutrophil count of 9.2 and some lymphocytosis.  Blood work in June 2020 from [place] which showed White count of 13.5, H&H of 17.9/52.8 and a platelet count of 285.  Differential mainly showed neutrophilia with an absolute neutrophil count of 9.6.  BCR ABL and peripheral flow cytometry was unremarkable.  Interval history- She does report ongoing fatigue.  She has trouble sleeping at night.  ECOG PS- 1 Pain scale- 0  Review of systems- Review of Systems  Constitutional: Positive for malaise/fatigue. Negative for chills, fever and weight loss.  HENT: Negative for congestion, ear discharge and nosebleeds.   Eyes: Negative for blurred vision.  Respiratory: Negative for cough, hemoptysis, sputum production, shortness of breath and wheezing.   Cardiovascular: Negative for chest pain, palpitations, orthopnea and claudication.  Gastrointestinal: Negative for abdominal pain, blood in stool, constipation, diarrhea, heartburn, melena, nausea and vomiting.  Genitourinary: Negative for dysuria, flank pain, frequency, hematuria and urgency.  Musculoskeletal:  Negative for back pain, joint pain and myalgias.  Skin: Negative for rash.  Neurological: Negative for dizziness, tingling, focal weakness, seizures, weakness and headaches.  Endo/Heme/Allergies: Does not bruise/bleed easily.  Psychiatric/Behavioral: Negative for depression and suicidal ideas. The patient has insomnia.      Allergies  Allergen Reactions  . Alpha-Gal Anaphylaxis  . Galactose Anaphylaxis  . Grape Seed Anaphylaxis  . Other Other (See Comments)    Tomatoes, dust and mold. 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 Sulfa Antibiotics Nausea And Vomiting  . Sulfasalazine Nausea And Vomiting  . Tomato Itching    Itchy throat     Past Medical History:  Diagnosis Date  . Hyperlipidemia   . Left arm weakness    due to recent RTR  . Obesity   . Sleep apnea    CPAP  . Tick bite    this caused the patient not to be able to eat meat any longer  . Wears contact lenses      Past Surgical History:  Procedure Laterality Date  . COLONOSCOPY WITH PROPOFOL N/A 03/04/2018   Procedure: COLONOSCOPY WITH BIOPSIES;  Surgeon: [name], MD;  Location: [place];  Service: Endoscopy;  Laterality: N/A;  sleep apnea  . FOOT SURGERY    . LAPAROSCOPIC GASTRIC SLEEVE RESECTION    . NASAL SINUS SURGERY    . OOPHORECTOMY Left    right was not removed  . POLYPECTOMY N/A 03/04/2018   Procedure: POLYPECTOMY;  Surgeon: [name], MD;  Location: [place];  Service: Endoscopy;  Laterality: N/A;  . ROTATOR CUFF REPAIR Left 12/31/2017   [facility] Dr Harlow Mares.  Marland Kitchen VAGINAL HYSTERECTOMY  1995    Social History   Socioeconomic History  . Marital  status: Married    Spouse name: Not on file  . Number of children: Not on file  . Years of education: Not on file  . Highest education level: Not on file  Occupational History  . Not on file  Tobacco Use  . Smoking status: Current Some Day Smoker    Packs/day: 1.00    Years: 30.00    Pack years:  30.00    Types: Cigarettes    Last attempt to quit: 10/24/2016    Years since quitting: 2.3  . Smokeless tobacco: Never Used  . Tobacco comment: 15 cig in a week  Substance and Sexual Activity  . Alcohol use: No    Alcohol/week: 0.0 standard drinks  . Drug use: No  . Sexual activity: Yes  Other Topics Concern  . Not on file  Social History Narrative  . Not on file   Social Determinants of Health   Financial Resource Strain:   . Difficulty of Paying Living Expenses: Not on file  Food Insecurity:   . Worried About Charity fundraiser in the Last Year: Not on file  . Ran Out of Food in the Last Year: Not on file  Transportation Needs:   . Lack of Transportation (Medical): Not on file  . Lack of Transportation (Non-Medical): Not on file  Physical Activity:   . Days of Exercise per Week: Not on file  . Minutes of Exercise per Session: Not on file  Stress:   . Feeling of Stress : Not on file  Social Connections:   . Frequency of Communication with Friends and Family: Not on file  . Frequency of Social Gatherings with Friends and Family: Not on file  . Attends Religious Services: Not on file  . Active Member of Clubs or Organizations: Not on file  . Attends Archivist Meetings: Not on file  . Marital Status: Not on file  Intimate Partner Violence:   . Fear of Current or Ex-Partner: Not on file  . Emotionally Abused: Not on file  . Physically Abused: Not on file  . Sexually Abused: Not on file    Family History  Problem Relation Age of Onset  . Breast cancer Maternal Aunt   . Breast cancer Maternal Grandmother      Current Outpatient Medications:  .  atorvastatin (LIPITOR) 10 MG tablet, Take 1 tablet (10 mg total) by mouth daily., Disp: 90 tablet, Rfl: 3 .  BIOTIN PO, Take 2 Doses/Fill by mouth daily. , Disp: , Rfl:  .  EPINEPHrine 0.3 mg/0.3 mL IJ SOAJ injection, INJ 0.3 ML IM ONCE FOR 1 DOSE, Disp: , Rfl: 3 .  Multiple Vitamins-Minerals (BARIATRIC  MULTIVITAMINS/IRON) CAPS, Take 1 tablet by mouth daily. , Disp: , Rfl:   Physical exam:  Vitals:   02/21/19 1012  BP: 128/80  Pulse: 70  Resp: 16  Temp: (!) 97.4 F (36.3 C)  TempSrc: Tympanic  Weight: 184 lb 1.6 oz (83.5 kg)   Physical Exam Constitutional:      General: She is not in acute distress. HENT:     Head: Normocephalic and atraumatic.  Eyes:     Pupils: Pupils are equal, round, and reactive to light.  Cardiovascular:     Rate and Rhythm: Normal rate and regular rhythm.     Heart sounds: Normal heart sounds.  Pulmonary:     Effort: Pulmonary effort is normal.     Breath sounds: Normal breath sounds.  Abdominal:     General: Bowel  sounds are normal.     Palpations: Abdomen is soft.  Musculoskeletal:     Cervical back: Normal range of motion.  Skin:    General: Skin is warm and dry.  Neurological:     Mental Status: She is alert and oriented to person, place, and time.      CMP Latest Ref Rng & Units 07/22/2018  Glucose 65 - 99 mg/dL 77  BUN 6 - 24 mg/dL 20  Creatinine 0.57 - 1.00 mg/dL 0.65  Sodium 134 - 144 mmol/L 140  Potassium 3.5 - 5.2 mmol/L 4.0  Chloride 96 - 106 mmol/L 98  CO2 20 - 29 mmol/L 24  Calcium 8.7 - 10.2 mg/dL 10.1  Total Protein 6.0 - 8.5 g/dL 7.2  Total Bilirubin 0.0 - 1.2 mg/dL 0.3  Alkaline Phos 39 - 117 IU/L 76  AST 0 - 40 IU/L 20  ALT 0 - 32 IU/L 18   CBC Latest Ref Rng & Units 07/22/2018  WBC 3.4 - 10.8 x10E3/uL 14.1(H)  Hemoglobin 11.1 - 15.9 g/dL 16.7(H)  Hematocrit 34.0 - 46.6 % 48.3(H)  Platelets 150 - 450 x10E3/uL 253    No images are attached to the encounter.  No results found.   Assessment and plan- Patient is a 54 y.o. female here for follow-up of for polycythemia and leukocytosis  1. Polycythemia: I have reviewed labs done at [place]. on 28 2020.  Patient noted to have a white count of 11.1, H&H is 10.3/49 and a platelet count of 55.  Differential mainly showed mild lymphocytosis with an absolute lymphocyte count  of 4.1.  I suspect both lymphocytosis and polycythemia is secondary to her smoking.  Prior flow cytometry and BCR ABL testing did not reveal any evidence of leukemia.  I will repeat her CBC with differential in 3 months and in 6 months and see her back next in 6 months months for a video visit   Visit Diagnosis 1. Polycythemia, secondary   2. Leukocytosis, unspecified type      Dr. Randa Evens, MD, MPH [facility] at [facility] [id number] 02/26/2019 7:57 AM

## 2019-02-27 NOTE — ED Provider Notes (Signed)
[facility]    CSN: [id number] Arrival date & time: 02/26/19  1030  History   Chief Complaint Chief Complaint  Patient presents with  . Headache  . Fatigue  . Cough   HPI  54 year old female presents with the above complaints.  Patient reports that her symptoms started on Thursday.  Her husband has recently been exposed to an individual with COVID-19.  She reports cough, congestion, headache, fatigue.  Patient states that she feels very poorly.  She states that she would like to be at home in the bed.  No relieving factors.  No known exacerbating factors.  Patient is concerned that she has COVID-19.  Desires testing today.  No other complaints.  PMH, Surgical Hx, Family Hx, Social History reviewed and updated as below.  Past Medical History:  Diagnosis Date  . Hyperlipidemia   . Left arm weakness    due to recent RTR  . Obesity   . Sleep apnea    CPAP  . Tick bite    this caused the patient not to be able to eat meat any longer  . Wears contact lenses     Patient Active Problem List   Diagnosis Date Noted  . Benign neoplasm of transverse colon   . Status post bariatric surgery 06/11/2017  . 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 OSA on CPAP 06/11/2017  . Viral warts 07/06/2014  . 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 Elevated blood pressure, situational 07/06/2014  . Carpal tunnel syndrome 07/06/2014  . COPD (chronic obstructive pulmonary disease) ([place]) 07/06/2014  . Compulsive tobacco user syndrome 07/06/2014  . Extreme obesity 07/06/2014    Past Surgical History:  Procedure Laterality Date  . COLONOSCOPY WITH PROPOFOL N/A 03/04/2018   Procedure: COLONOSCOPY WITH BIOPSIES;  Surgeon: [name], MD;  Location: [place];  Service: Endoscopy;  Laterality: N/A;  sleep apnea  . FOOT SURGERY    . LAPAROSCOPIC GASTRIC SLEEVE RESECTION    . NASAL SINUS SURGERY    . OOPHORECTOMY Left    right was not removed  .  POLYPECTOMY N/A 03/04/2018   Procedure: POLYPECTOMY;  Surgeon: [name], MD;  Location: [place];  Service: Endoscopy;  Laterality: N/A;  . ROTATOR CUFF REPAIR Left 12/31/2017    [facility].  Marland Kitchen VAGINAL HYSTERECTOMY  1995    OB History   No obstetric history on file.      Home Medications    Prior to Admission medications   Medication Sig Start Date End Date Taking? Authorizing Provider  atorvastatin (LIPITOR) 10 MG tablet Take 1 tablet (10 mg total) by mouth daily. 08/01/18  Yes Mar Daring, PA-C  BIOTIN PO Take 2 Doses/Fill by mouth daily.    Yes [provider]  Multiple Vitamins-Minerals (BARIATRIC MULTIVITAMINS/IRON) CAPS Take 1 tablet by mouth daily.    Yes [provider]  butalbital-acetaminophen-caffeine (FIORICET) 50-325-40 MG tablet Take 1 tablet by mouth every 6 (six) hours as needed for headache or migraine. 02/26/19 02/26/20  Coral Spikes, DO  EPINEPHrine 0.3 mg/0.3 mL IJ SOAJ injection INJ 0.3 ML IM ONCE FOR 1 DOSE 06/11/17   [provider]    Family History Family History  Problem Relation Age of Onset  . Breast cancer Maternal Aunt   . Breast cancer Maternal Grandmother     Social History Social History   Tobacco Use  . Smoking status: Current Some Day Smoker    Packs/day: 1.00    Years:  30.00    Pack years: 30.00    Types: Cigarettes    Last attempt to quit: 10/24/2016    Years since quitting: 2.3  . Smokeless tobacco: Never Used  . Tobacco comment: 15 cig in a week  Substance Use Topics  . Alcohol use: No    Alcohol/week: 0.0 standard drinks  . Drug use: No    Allergies   Alpha-gal, Galactose, Grape seed, Other, Sulfa antibiotics, Sulfasalazine, and Tomato   Review of Systems Review of Systems  Constitutional: Positive for fatigue. Negative for fever.  HENT: Positive for congestion.   Respiratory: Positive for cough.   Neurological: Positive for headaches.   Physical Exam Triage  Vital Signs ED Triage Vitals  Enc Vitals Group     BP 02/26/19 1112 130/79     Pulse Rate 02/26/19 1112 61     Resp 02/26/19 1112 14     Temp 02/26/19 1112 98 F (36.7 C)     Temp Source 02/26/19 1112 Oral     SpO2 --      Weight 02/26/19 1108 179 lb (81.2 kg)     Height 02/26/19 1108 5\' 5"  (1.651 m)     Head Circumference --      Peak Flow --      Pain Score 02/26/19 1107 8     Pain Loc --      Pain Edu? --      Excl. in [place]? --    Updated Vital Signs BP 130/79 (BP Location: Right Arm)   Pulse 61   Temp 98 F (36.7 C) (Oral)   Resp 14   Ht 5\' 5"  (1.651 m)   Wt 81.2 kg   BMI 29.79 kg/m   Visual Acuity Right Eye Distance:   Left Eye Distance:   Bilateral Distance:    Right Eye Near:   Left Eye Near:    Bilateral Near:     Physical Exam Vitals and nursing note reviewed.  Constitutional:      General: She is not in acute distress.    Appearance: Normal appearance. She is not ill-appearing.  HENT:     Head: Normocephalic and atraumatic.  Eyes:     General:        Right eye: No discharge.        Left eye: No discharge.     Conjunctiva/sclera: Conjunctivae normal.  Cardiovascular:     Rate and Rhythm: Normal rate and regular rhythm.     Heart sounds: No murmur.  Pulmonary:     Effort: Pulmonary effort is normal.     Breath sounds: Normal breath sounds. No wheezing, rhonchi or rales.  Skin:    General: Skin is warm.     Findings: No rash.  Neurological:     Mental Status: She is alert.  Psychiatric:        Mood and Affect: Mood normal.        Behavior: Behavior normal.    UC Treatments / Results  Labs (all labs ordered are listed, but only abnormal results are displayed) Labs Reviewed  NOVEL CORONAVIRUS, NAA (HOSP ORDER, SEND-OUT TO REF LAB; TAT 18-24 HRS)    EKG   Radiology No results found.  Procedures Procedures (including critical care time)  Medications Ordered in UC Medications - No data to display  Initial Impression / Assessment  and Plan / UC Course  I have reviewed the triage vital signs and the nursing notes.  Pertinent labs & imaging results that were  available during my care of the patient were reviewed by me and considered in my medical decision making (see chart for details).    54 year old female presents with suspected COVID-19.  Patient states that her most troublesome symptom is headache.  Treating with Fioricet.  Rest, fluids.  Supportive care.  Awaiting Covid test results.  Final Clinical Impressions(s) / UC Diagnoses   Final diagnoses:  Suspected COVID-19 virus infection     Discharge Instructions     Rest.  Fluids.  Medication as needed for headache.  If you worsen, go to the hospital.  Results available in 24-48 hours.  Dr. Lacinda Axon    ED Prescriptions    Medication Sig Dispense Auth. Provider   butalbital-acetaminophen-caffeine (FIORICET) 50-325-40 MG tablet Take 1 tablet by mouth every 6 (six) hours as needed for headache or migraine. 20 tablet Coral Spikes, DO     PDMP not reviewed this encounter.   Coral Spikes, [place] 02/27/19 1306

## 2019-02-28 LAB — NOVEL CORONAVIRUS, NAA (HOSP ORDER, SEND-OUT TO REF LAB; TAT 18-24 HRS): SARS-CoV-2, NAA: NOT DETECTED

## 2019-05-24 ENCOUNTER — Encounter: Payer: Self-pay | Admitting: Oncology

## 2019-07-28 NOTE — Progress Notes (Signed)
Complete physical exam   Patient: Sarah Pham   DOB: [birth year]   54 y.o. Female  MRN: [id number] Visit Date: 07/31/2019  Today's healthcare provider: Mar Daring, PA-C   Chief Complaint  Patient presents with  . Annual Exam   Subjective    Sarah Pham is a 54 y.o. female who presents today for a complete physical exam.  She reports consuming a general diet. Home exercise routine includes yard work and walking. She generally feels well. She reports sleeping poorly. She does have additional problems to discuss today.  HPI  Patient reports that her hand and legs and feet are getting swollen.  Past Medical History:  Diagnosis Date  . Hyperlipidemia   . Left arm weakness    due to recent RTR  . Obesity   . Sleep apnea    CPAP  . Tick bite    this caused the patient not to be able to eat meat any longer  . Wears contact lenses    Past Surgical History:  Procedure Laterality Date  . COLONOSCOPY WITH PROPOFOL N/A 03/04/2018   Procedure: COLONOSCOPY WITH BIOPSIES;  Surgeon: [name], MD;  Location: [place];  Service: Endoscopy;  Laterality: N/A;  sleep apnea  . FOOT SURGERY    . LAPAROSCOPIC GASTRIC SLEEVE RESECTION    . NASAL SINUS SURGERY    . OOPHORECTOMY Left    right was not removed  . POLYPECTOMY N/A 03/04/2018   Procedure: POLYPECTOMY;  Surgeon: [name], MD;  Location: [facility];  Service: Endoscopy;  Laterality: N/A;  . ROTATOR CUFF REPAIR Left 12/31/2017   [facility].  Marland Kitchen VAGINAL HYSTERECTOMY  1995   Social History   Socioeconomic History  . Marital status: Married    Spouse name: Not on file  . Number of children: Not on file  . Years of education: Not on file  . Highest education level: Not on file  Occupational History  . Not on file  Tobacco Use  . Smoking status: Current Some Day Smoker    Packs/day: 1.00    Years: 30.00    Pack years: 30.00    Types: Cigarettes    Last attempt to quit:  10/24/2016    Years since quitting: 2.7  . Smokeless tobacco: Never Used  . Tobacco comment: 15 cig in a week  Substance and Sexual Activity  . Alcohol use: No    Alcohol/week: 0.0 standard drinks  . Drug use: No  . Sexual activity: Yes  Other Topics Concern  . Not on file  Social History Narrative  . Not on file   Social Determinants of Health   Financial Resource Strain:   . Difficulty of Paying Living Expenses:   Food Insecurity:   . Worried About Charity fundraiser in the Last Year:   . Arboriculturist in the Last Year:   Transportation Needs:   . Film/video editor (Medical):   Marland Kitchen Lack of Transportation (Non-Medical):   Physical Activity:   . Days of Exercise per Week:   . Minutes of Exercise per Session:   Stress:   . Feeling of Stress :   Social Connections:   . Frequency of Communication with Friends and Family:   . Frequency of Social Gatherings with Friends and Family:   . Attends Religious Services:   . Active Member of Clubs or Organizations:   . Attends Archivist Meetings:   .  Marital Status:   Intimate Partner Violence:   . Fear of Current or Ex-Partner:   . Emotionally Abused:   Marland Kitchen Physically Abused:   . Sexually Abused:    Family Status  Relation Name Status  . Mother  Alive  . Father  Alive  . Mat Aunt  (Not Specified)  . MGM  (Not Specified)   Family History  Problem Relation Age of Onset  . Breast cancer Maternal Aunt   . Breast cancer Maternal Grandmother    Allergies  Allergen Reactions  . Alpha-Gal Anaphylaxis  . Galactose Anaphylaxis  . Grape Seed Anaphylaxis  . Other Other (See Comments)    Tomatoes, dust and mold. 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 Sulfa Antibiotics Nausea And Vomiting  . Sulfasalazine Nausea And Vomiting  . Tomato Itching    Itchy throat    Patient Care Team: Mar Daring, PA-C as PCP - General (Family Medicine)   Medications: Outpatient Medications Prior to Visit   Medication Sig  . atorvastatin (LIPITOR) 10 MG tablet Take 1 tablet (10 mg total) by mouth daily.  Marland Kitchen BIOTIN PO Take 2 Doses/Fill by mouth daily.   Marland Kitchen EPINEPHrine 0.3 mg/0.3 mL IJ SOAJ injection INJ 0.3 ML IM ONCE FOR 1 DOSE  . Multiple Vitamins-Minerals (BARIATRIC MULTIVITAMINS/IRON) CAPS Take 1 tablet by mouth daily.   . butalbital-acetaminophen-caffeine (FIORICET) 50-325-40 MG tablet Take 1 tablet by mouth every 6 (six) hours as needed for headache or migraine. (Patient not taking: Reported on 07/31/2019)   No facility-administered medications prior to visit.    Review of Systems  Constitutional: Positive for diaphoresis.  HENT: Negative.   Eyes: Negative.   Respiratory: Negative.   Cardiovascular: Negative.   Gastrointestinal: Negative.   Endocrine: Negative.   Genitourinary: Negative.   Musculoskeletal: Negative.   Skin: Negative.   Allergic/Immunologic: Positive for food allergies.  Neurological: Negative.   Hematological: Negative.   Psychiatric/Behavioral: Negative.       Objective    BP 113/78 (BP Location: Right Arm, Patient Position: Sitting, Cuff Size: Normal)   Pulse 74   Temp (!) 97 F (36.1 C) (Temporal)   Resp 16   Ht 5\' 5"  (1.651 m)   Wt 181 lb 12.8 oz (82.5 kg)   BMI 30.25 kg/m    Physical Exam Vitals reviewed.  Constitutional:      General: She is not in acute distress.    Appearance: Normal appearance. She is well-developed. She is obese. She is not ill-appearing or diaphoretic.  HENT:     Head: Normocephalic and atraumatic.     Right Ear: Tympanic membrane, ear canal and external ear normal.     Left Ear: Tympanic membrane, ear canal and external ear normal.     Nose: Nose normal.     Mouth/Throat:     Mouth: Mucous membranes are moist.     Pharynx: Oropharynx is clear. No oropharyngeal exudate or posterior oropharyngeal erythema.  Eyes:     General: No scleral icterus.       Right eye: No discharge.        Left eye: No discharge.      Extraocular Movements: Extraocular movements intact.     Conjunctiva/sclera: Conjunctivae normal.     Pupils: Pupils are equal, round, and reactive to light.  Neck:     Thyroid: No thyromegaly.     Vascular: No carotid bruit or JVD.     Trachea: No tracheal deviation.  Cardiovascular:  Rate and Rhythm: Normal rate and regular rhythm.     Pulses: Normal pulses.     Heart sounds: Normal heart sounds. No murmur. No friction rub. No gallop.   Pulmonary:     Effort: Pulmonary effort is normal. No respiratory distress.     Breath sounds: Normal breath sounds. No wheezing or rales.  Chest:     Chest wall: No tenderness.     Breasts:        Right: Normal.        Left: Normal.  Abdominal:     General: Abdomen is flat. Bowel sounds are normal. There is no distension.     Palpations: Abdomen is soft. There is no mass.     Tenderness: There is no abdominal tenderness. There is no guarding or rebound.  Musculoskeletal:        General: No tenderness. Normal range of motion.     Cervical back: Normal range of motion and neck supple.     Right lower leg: No edema.     Left lower leg: No edema.  Lymphadenopathy:     Cervical: No cervical adenopathy.  Skin:    General: Skin is warm and dry.     Capillary Refill: Capillary refill takes less than 2 seconds.     Findings: No rash.  Neurological:     General: No focal deficit present.     Mental Status: She is alert and oriented to person, place, and time. Mental status is at baseline.  Psychiatric:        Mood and Affect: Mood normal.        Behavior: Behavior normal.        Thought Content: Thought content normal.        Judgment: Judgment normal.      Depression Screen  PHQ 2/9 Scores 07/31/2019 07/22/2018 07/15/2017  PHQ - 2 Score 0 0 0    No results found for any visits on 07/31/19.  Assessment & Plan    Routine Health Maintenance and Physical Exam  Exercise Activities and Dietary recommendations Goals   None      Immunization History  Administered Date(s) Administered  . Influenza,inj,Quad PF,6+ Mos 12/22/2017, 12/11/2018  . Td 02/23/2005  . Tdap 07/22/2018    Health Maintenance  Topic Date Due  . COVID-19 Vaccine (1) Never done  . PAP SMEAR-Modifier  06/06/2019  . INFLUENZA VACCINE  09/24/2019  . MAMMOGRAM  08/04/2020  . COLONOSCOPY  03/05/2023  . TETANUS/TDAP  07/21/2028  . HIV Screening  Completed    Discussed health benefits of physical activity, and encouraged her to engage in regular exercise appropriate for her age and condition.  1. Annual physical exam Normal physical exam today. Will check labs as below and f/u pending lab results. If labs are stable and WNL she will not need to have these rechecked for one year at her next annual physical exam. She is to call the office in the meantime if she has any acute issue, questions or concerns.  2. Encounter for breast cancer screening using non-mammogram modality Breast exam today was normal. There is no family history of breast cancer. She does perform regular self breast exams. Mammogram was ordered as below. Information for [facility] was given to patient so she may schedule her mammogram at her convenience. - MM 3D SCREEN BREAST BILATERAL; Future  3. Elevated blood pressure, situational Stable. Will check labs as below and f/u pending results. - CBC with Differential/Platelet - Comprehensive  metabolic panel  4. OSA on CPAP Not needed since weight loss.  - CBC with Differential/Platelet - Comprehensive metabolic panel  5. Familial multiple lipoprotein-type hyperlipidemia Stable on Atorvastatin 10mg . Will check labs as below and f/u pending results. - CBC with Differential/Platelet - Comprehensive metabolic panel - Lipid panel  6. Status post bariatric surgery Doing well. Has lost from 270s to now 181 pounds. Will check labs as below and f/u pending results. 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 Will check to make sure allergy is still present.  - Alpha-Gal Panel  8. Thyroid disorder screen Will check labs as below and f/u pending results. - TSH  9. Swelling Noted in hands and ankles. May use Furosemide prn.  - furosemide (LASIX) 20 MG tablet; Take 1 tablet (20 mg total) by mouth daily as needed for edema.  Dispense: 30 tablet; Refill: 3  10. Intertrigo Due to skin folds/loose skin from weight loss. Nystatin given for acute needs. Referral to plastics for considerations of tummy tuck/removal.  - nystatin cream (MYCOSTATIN); Apply 1 application topically 2 (two) times daily.  Dispense: 30 g; Refill: 0 - Ambulatory referral to Plastic Surgery  11. Mixed simple and mucopurulent chronic bronchitis ([place]) Doing well since cutting back on smoking and with weight loss.    No follow-ups on file.     Reynolds Bowl, PA-C, have reviewed all documentation for this visit. The documentation on 08/02/19 for the exam, diagnosis, procedures, and orders are all accurate and complete.   Rubye Beach  [facility] [phone] (phone) [phone] (fax)  [place]

## 2019-07-31 ENCOUNTER — Ambulatory Visit (INDEPENDENT_AMBULATORY_CARE_PROVIDER_SITE_OTHER): Payer: Managed Care, Other (non HMO) | Admitting: Physician Assistant

## 2019-07-31 ENCOUNTER — Encounter: Payer: Self-pay | Admitting: Physician Assistant

## 2019-07-31 ENCOUNTER — Other Ambulatory Visit: Payer: Self-pay

## 2019-07-31 VITALS — BP 113/78 | HR 74 | Temp 97.0°F | Resp 16 | Ht 65.0 in | Wt 181.8 lb

## 2019-07-31 DIAGNOSIS — Z9884 Bariatric surgery status: Secondary | ICD-10-CM

## 2019-07-31 DIAGNOSIS — J418 Mixed simple and mucopurulent chronic bronchitis: Secondary | ICD-10-CM | POA: Diagnosis not present

## 2019-07-31 DIAGNOSIS — Z9989 Dependence on other enabling machines and devices: Secondary | ICD-10-CM

## 2019-07-31 DIAGNOSIS — G4733 Obstructive sleep apnea (adult) (pediatric): Secondary | ICD-10-CM | POA: Diagnosis not present

## 2019-07-31 DIAGNOSIS — E756 Lipid storage disorder, unspecified: Secondary | ICD-10-CM

## 2019-07-31 DIAGNOSIS — Z1239 Encounter for other screening for malignant neoplasm of breast: Secondary | ICD-10-CM

## 2019-07-31 DIAGNOSIS — Z1329 Encounter for screening for other suspected endocrine disorder: Secondary | ICD-10-CM

## 2019-07-31 DIAGNOSIS — L304 Erythema intertrigo: Secondary | ICD-10-CM | POA: Diagnosis not present

## 2019-07-31 DIAGNOSIS — R030 Elevated blood-pressure reading, without diagnosis of hypertension: Secondary | ICD-10-CM | POA: Diagnosis not present

## 2019-07-31 DIAGNOSIS — Z0000 Encounter for general adult medical examination without abnormal findings: Secondary | ICD-10-CM

## 2019-07-31 DIAGNOSIS — R609 Edema, unspecified: Secondary | ICD-10-CM | POA: Diagnosis not present

## 2019-07-31 DIAGNOSIS — E8809 Other disorders of plasma-protein metabolism, not elsewhere classified: Secondary | ICD-10-CM

## 2019-07-31 DIAGNOSIS — E7849 Other hyperlipidemia: Secondary | ICD-10-CM

## 2019-07-31 MED ORDER — FUROSEMIDE 20 MG PO TABS: 20.0000 mg | ORAL_TABLET | Freq: Every day | ORAL | 3 refills | Status: DC | PRN

## 2019-07-31 MED ORDER — NYSTATIN 100000 UNIT/GM EX CREA: 1.0000 "application " | TOPICAL_CREAM | Freq: Two times a day (BID) | CUTANEOUS | 0 refills | Status: DC

## 2019-07-31 NOTE — Patient Instructions (Signed)
Black cohosh   Menopause Menopause is the normal time of life when menstrual periods stop completely. It is usually confirmed by 12 months without a menstrual period. The transition to menopause (perimenopause) most often happens between the ages of 75 and 65. During perimenopause, hormone levels change in your body, which can cause symptoms and affect your health. Menopause may increase your risk for:  Loss of bone (osteoporosis), which causes bone breaks (fractures).  Depression.  Hardening and narrowing of the arteries (atherosclerosis), which can cause heart attacks and strokes. What are the causes? This condition is usually caused by a natural change in hormone levels that happens as you get older. The condition may also be caused by surgery to remove both ovaries (bilateral oophorectomy). What increases the risk? This condition is more likely to start at an earlier age if you have certain medical conditions or treatments, including:  A tumor of the pituitary gland in the brain.  A disease that affects the ovaries and hormone production.  Radiation treatment for cancer.  Certain cancer treatments, such as chemotherapy or hormone (anti-estrogen) therapy.  Heavy smoking and excessive alcohol use.  Family history of early menopause. This condition is also more likely to develop earlier in women who are very thin. What are the signs or symptoms? Symptoms of this condition include:  Hot flashes.  Irregular menstrual periods.  Night sweats.  Changes in feelings about sex. This could be a decrease in sex drive or an increased comfort around your sexuality.  Vaginal dryness and thinning of the vaginal walls. This may cause painful intercourse.  Dryness of the skin and development of wrinkles.  Headaches.  Problems sleeping (insomnia).  Mood swings or irritability.  Memory problems.  Weight gain.  Hair growth on the face and chest.  Bladder infections or problems  with urinating. How is this diagnosed? This condition is diagnosed based on your medical history, a physical exam, your age, your menstrual history, and your symptoms. Hormone tests may also be done. How is this treated? In some cases, no treatment is needed. You and your health care provider should make a decision together about whether treatment is necessary. Treatment will be based on your individual condition and preferences. Treatment for this condition focuses on managing symptoms. Treatment may include:  Menopausal hormone therapy (MHT).  Medicines to treat specific symptoms or complications.  Acupuncture.  Vitamin or herbal supplements. Before starting treatment, make sure to let your health care provider know if you have a personal or family history of:  Heart disease.  Breast cancer.  Blood clots.  Diabetes.  Osteoporosis. Follow these instructions at home: Lifestyle  Do not use any products that contain nicotine or tobacco, such as cigarettes and e-cigarettes. If you need help quitting, ask your health care provider.  Get at least 30 minutes of physical activity on 5 or more days each week.  Avoid alcoholic and caffeinated beverages, as well as spicy foods. This may help prevent hot flashes.  Get 7-8 hours of sleep each night.  If you have hot flashes, try: ? Dressing in layers. ? Avoiding things that may trigger hot flashes, such as spicy food, warm places, or stress. ? Taking slow, deep breaths when a hot flash starts. ? Keeping a fan in your home and office.  Find ways to manage stress, such as deep breathing, meditation, or journaling.  Consider going to group therapy with other women who are having menopause symptoms. Ask your health care provider about recommended group  therapy meetings. Eating and drinking  Eat a healthy, balanced diet that contains whole grains, lean protein, low-fat dairy, and plenty of fruits and vegetables.  Your health care  provider may recommend adding more soy to your diet. Foods that contain soy include tofu, tempeh, and soy milk.  Eat plenty of foods that contain calcium and vitamin D for bone health. Items that are rich in calcium include low-fat milk, yogurt, beans, almonds, sardines, broccoli, and kale. Medicines  Take over-the-counter and prescription medicines only as told by your health care provider.  Talk with your health care provider before starting any herbal supplements. If prescribed, take vitamins and supplements as told by your health care provider. These may include: ? Calcium. Women age 63 and older should get 1,200 mg (milligrams) of calcium every day. ? Vitamin D. Women need 600-800 International Units of vitamin D each day. ? Vitamins B12 and B6. Aim for 50 micrograms of B12 and 1.5 mg of B6 each day. General instructions  Keep track of your menstrual periods, including: ? When they occur. ? How heavy they are and how long they last. ? How much time passes between periods.  Keep track of your symptoms, noting when they start, how often you have them, and how long they last.  Use vaginal lubricants or moisturizers to help with vaginal dryness and improve comfort during sex.  Keep all follow-up visits as told by your health care provider. This is important. This includes any group therapy or counseling. Contact a health care provider if:  You are still having menstrual periods after age 14.  You have pain during sex.  You have not had a period for 12 months and you develop vaginal bleeding. Get help right away if:  You have: ? Severe depression. ? Excessive vaginal bleeding. ? Pain when you urinate. ? A fast or irregular heart beat (palpitations). ? Severe headaches. ? Abdomen (abdominal) pain or severe indigestion.  You fell and you think you have a broken bone.  You develop leg or chest pain.  You develop vision problems.  You feel a lump in your  breast. Summary  Menopause is the normal time of life when menstrual periods stop completely. It is usually confirmed by 12 months without a menstrual period.  The transition to menopause (perimenopause) most often happens between the ages of 80 and 29.  Symptoms can be managed through medicines, lifestyle changes, and complementary therapies such as acupuncture.  Eat a balanced diet that is rich in nutrients to promote bone health and heart health and to manage symptoms during menopause. This information is not intended to replace advice given to you by your health care provider. Make sure you discuss any questions you have with your health care provider. Document Revised: 01/22/2017 Document Reviewed: 03/14/2016 Elsevier Patient Education  2020 [employer].

## 2019-08-17 ENCOUNTER — Encounter: Payer: Self-pay | Admitting: Oncology

## 2019-08-22 ENCOUNTER — Encounter: Payer: Self-pay | Admitting: Oncology

## 2019-08-22 ENCOUNTER — Inpatient Hospital Stay: Payer: Managed Care, Other (non HMO) | Attending: Oncology | Admitting: Oncology

## 2019-08-22 DIAGNOSIS — D72829 Elevated white blood cell count, unspecified: Secondary | ICD-10-CM | POA: Diagnosis not present

## 2019-08-22 DIAGNOSIS — D72820 Lymphocytosis (symptomatic): Secondary | ICD-10-CM

## 2019-08-22 LAB — LIPID PANEL
Chol/HDL Ratio: 4.8 ratio — ABNORMAL HIGH (ref 0.0–4.4)
Cholesterol, Total: 182 mg/dL (ref 100–199)
HDL: 38 mg/dL — ABNORMAL LOW (ref >39)
LDL Chol Calc (NIH): 130 mg/dL — ABNORMAL HIGH (ref 0–99)
Triglycerides: 72 mg/dL (ref 0–149)
VLDL Cholesterol Cal: 14 mg/dL (ref 5–40)

## 2019-08-22 LAB — IRON,TIBC AND FERRITIN PANEL
Ferritin: 174 ng/mL — ABNORMAL HIGH (ref 15–150)
Iron Saturation: 22 % (ref 15–55)
Iron: 75 ug/dL (ref 27–159)
Total Iron Binding Capacity: 343 ug/dL (ref 250–450)
UIBC: 268 ug/dL (ref 131–425)

## 2019-08-22 LAB — COMPREHENSIVE METABOLIC PANEL
ALT: 16 IU/L (ref 0–32)
AST: 20 IU/L (ref 0–40)
Albumin/Globulin Ratio: 1.7 (ref 1.2–2.2)
Albumin: 4.7 g/dL (ref 3.8–4.9)
Alkaline Phosphatase: 73 IU/L (ref 48–121)
BUN/Creatinine Ratio: 34 — ABNORMAL HIGH (ref 9–23)
BUN: 20 mg/dL (ref 6–24)
Bilirubin Total: 0.4 mg/dL (ref 0.0–1.2)
CO2: 24 mmol/L (ref 20–29)
Calcium: 10.3 mg/dL — ABNORMAL HIGH (ref 8.7–10.2)
Chloride: 103 mmol/L (ref 96–106)
Creatinine, Ser: 0.58 mg/dL (ref 0.57–1.00)
GFR calc Af Amer: 123 mL/min/{1.73_m2} (ref >59)
GFR calc non Af Amer: 107 mL/min/{1.73_m2} (ref >59)
Globulin, Total: 2.8 g/dL (ref 1.5–4.5)
Glucose: 85 mg/dL (ref 65–99)
Potassium: 4.4 mmol/L (ref 3.5–5.2)
Sodium: 140 mmol/L (ref 134–144)
Total Protein: 7.5 g/dL (ref 6.0–8.5)

## 2019-08-22 LAB — CBC WITH DIFFERENTIAL/PLATELET
Basophils Absolute: 0.1 10*3/uL (ref 0.0–0.2)
Basos: 0 %
EOS (ABSOLUTE): 0.2 10*3/uL (ref 0.0–0.4)
Eos: 2 %
Hematocrit: 47.1 % — ABNORMAL HIGH (ref 34.0–46.6)
Hemoglobin: 16.3 g/dL — ABNORMAL HIGH (ref 11.1–15.9)
Immature Grans (Abs): 0 10*3/uL (ref 0.0–0.1)
Immature Granulocytes: 0 %
Lymphocytes Absolute: 3.4 10*3/uL — ABNORMAL HIGH (ref 0.7–3.1)
Lymphs: 31 %
MCH: 30.1 pg (ref 26.6–33.0)
MCHC: 34.6 g/dL (ref 31.5–35.7)
MCV: 87 fL (ref 79–97)
Monocytes Absolute: 0.6 10*3/uL (ref 0.1–0.9)
Monocytes: 5 %
Neutrophils Absolute: 6.9 10*3/uL (ref 1.4–7.0)
Neutrophils: 62 %
Platelets: 242 10*3/uL (ref 150–450)
RBC: 5.42 x10E6/uL — ABNORMAL HIGH (ref 3.77–5.28)
RDW: 13 % (ref 11.7–15.4)
WBC: 11.1 10*3/uL — ABNORMAL HIGH (ref 3.4–10.8)

## 2019-08-22 LAB — VITAMIN A: Vitamin A: 39 ug/dL (ref 20.1–62.0)

## 2019-08-22 LAB — ALPHA-GAL PANEL
Alpha Gal IgE*: 5.84 kU/L — ABNORMAL HIGH (ref <0.10)
Beef (Bos spp) IgE: 2.25 kU/L — ABNORMAL HIGH (ref <0.35)
Class Interpretation: 1
Class Interpretation: 2
Class Interpretation: 2
Lamb/Mutton (Ovis spp) IgE: 0.61 kU/L — ABNORMAL HIGH (ref <0.35)
Pork (Sus spp) IgE: 1.07 kU/L — ABNORMAL HIGH (ref <0.35)

## 2019-08-22 LAB — PHOSPHORUS: Phosphorus: 2.8 mg/dL — ABNORMAL LOW (ref 3.0–4.3)

## 2019-08-22 LAB — COPPER, SERUM: Copper: 122 ug/dL (ref 80–158)

## 2019-08-22 LAB — VITAMIN K1, SERUM: VITAMIN K1: 0.27 ng/mL (ref 0.10–2.20)

## 2019-08-22 LAB — HEMOGLOBIN A1C
Est. average glucose Bld gHb Est-mCnc: 105 mg/dL
Hgb A1c MFr Bld: 5.3 % (ref 4.8–5.6)

## 2019-08-22 LAB — TSH: TSH: 0.644 u[IU]/mL (ref 0.450–4.500)

## 2019-08-22 LAB — VITAMIN D 25 HYDROXY (VIT D DEFICIENCY, FRACTURES): Vit D, 25-Hydroxy: 48.5 ng/mL (ref 30.0–100.0)

## 2019-08-22 LAB — ZINC: Zinc: 87 ug/dL (ref 44–115)

## 2019-08-22 NOTE — Progress Notes (Signed)
Patient called/ pre- screened for virtual appoinment today with oncologist. Concerns of hot flashes and headaches.

## 2019-08-22 NOTE — Progress Notes (Signed)
I connected with Sarah Pham on 08/22/19 at 11:30 AM EDT by video enabled telemedicine visit and verified that I am speaking with the correct person using two identifiers.   I discussed the limitations, risks, security and privacy concerns of performing an evaluation and management service by telemedicine and the availability of in-person appointments. I also discussed with the patient that there may be a patient responsible charge related to this service. The patient expressed understanding and agreed to proceed.  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 She has been referred to [place] for leukocytosis.Most recent CBC from 07/22/2018 showed a white count of 14.1, H&H of 16.7/48.3 and a platelet count of 253. Differential on the CBC showed mainly neutrophilia with an elevated absolute neutrophil count of 9.2 and some lymphocytosis.  Blood work in June 2020 from [facility] which showedWhite count of 13.5, H&H of 17.9/52.8 and a platelet count of 285. Differential mainly showed neutrophilia with an absolute neutrophil count of 9.6. BCR ABL and peripheral flow cytometry was unremarkable.  EPO levels were normal and JAK2 mutation testing was negative    Interval history patient currently reports doing well and denies any complaints at this time.  She continues to smoke but has cut down on her smoking   Review of Systems  Constitutional: Positive for malaise/fatigue. Negative for chills, fever and weight loss.  HENT: Negative for congestion, ear discharge and nosebleeds.    Eyes: Negative for blurred vision.  Respiratory: Negative for cough, hemoptysis, sputum production, shortness of breath and wheezing.   Cardiovascular: Negative for chest pain, palpitations, orthopnea and claudication.  Gastrointestinal: Negative for abdominal pain, blood in stool, constipation, diarrhea, heartburn, melena, nausea and vomiting.  Genitourinary: Negative for dysuria, flank pain, frequency, hematuria and urgency.  Musculoskeletal: Negative for back pain, joint pain and myalgias.  Skin: Negative for rash.  Neurological: Negative for dizziness, tingling, focal weakness, seizures, weakness and headaches.  Endo/Heme/Allergies: Does not bruise/bleed easily.  Psychiatric/Behavioral: Negative for depression and suicidal ideas. The patient does not have insomnia.     Allergies  Allergen Reactions   Alpha-Gal Anaphylaxis   Galactose Anaphylaxis   Grape Seed Anaphylaxis   Other Other (See Comments)    Tomatoes, dust and mold. Patient has Alpha Gal- Allergic to all hooved animal   Sulfa Antibiotics Nausea And Vomiting   Sulfasalazine Nausea And Vomiting   Tomato Itching    Itchy throat    Past Medical History:  Diagnosis Date   Hyperlipidemia    Left arm weakness    due to recent RTR   Obesity    Sleep apnea    CPAP   Tick bite    this caused the patient not to be able to eat meat any longer   Wears contact lenses     Past Surgical History:  Procedure Laterality Date   COLONOSCOPY WITH PROPOFOL N/A 03/04/2018   Procedure: COLONOSCOPY WITH BIOPSIES;  Surgeon: [name], MD;  Location: [facility];  Service: Endoscopy;  Laterality: N/A;  sleep apnea   FOOT SURGERY     LAPAROSCOPIC GASTRIC SLEEVE RESECTION     NASAL SINUS  SURGERY     OOPHORECTOMY Left    right was not removed   POLYPECTOMY N/A 03/04/2018   Procedure: POLYPECTOMY;  Surgeon: [name], MD;  Location: [facility];  Service: Endoscopy;  Laterality: N/A;   ROTATOR  CUFF REPAIR Left 12/31/2017   [facility] Dr [facility]low Mares.   VAGINAL HYSTERECTOMY  1995    Social History   Socioeconomic History   Marital status: Married    Spouse name: Not on file   Number of children: Not on file   Years of education: Not on file   Highest education level: Not on file  Occupational History   Not on file  Tobacco Use   Smoking status: Current Some Day Smoker    Packs/day: 1.00    Years: 30.00    Pack years: 30.00    Types: Cigarettes    Last attempt to quit: 10/24/2016    Years since quitting: 2.8   Smokeless tobacco: Never Used   Tobacco comment: 15 cig in a week  Vaping Use   Vaping Use: Never used  Substance and Sexual Activity   Alcohol use: No    Alcohol/week: 0.0 standard drinks   Drug use: No   Sexual activity: Yes  Other Topics Concern   Not on file  Social History Narrative   Not on file   Social Determinants of Health   Financial Resource Strain:    Difficulty of Paying Living Expenses:   Food Insecurity:    Worried About Charity fundraiser in the Last Year:    Arboriculturist in the Last Year:   Transportation Needs:    Film/video editor (Medical):    Lack of Transportation (Non-Medical):   Physical Activity:    Days of Exercise per Week:    Minutes of Exercise per Session:   Stress:    Feeling of Stress :   Social Connections:    Frequency of Communication with Friends and Family:    Frequency of Social Gatherings with Friends and Family:    Attends Religious Services:    Active Member of Clubs or Organizations:    Attends Music therapist:    Marital Status:   Intimate Partner Violence:    Fear of Current or Ex-Partner:    Emotionally Abused:    Physically Abused:    Sexually Abused:     Family History  Problem Relation Age of Onset   Breast cancer Maternal Aunt    Breast cancer Maternal Grandmother      Current Outpatient Medications:    atorvastatin  (LIPITOR) 10 MG tablet, Take 1 tablet (10 mg total) by mouth daily., Disp: 90 tablet, Rfl: 3   BIOTIN PO, Take 2 Doses/Fill by mouth daily. , Disp: , Rfl:    Black Cohosh 540 MG CAPS, Take by mouth every morning., Disp: , Rfl:    EPINEPHrine 0.3 mg/0.3 mL IJ SOAJ injection, INJ 0.3 ML IM ONCE FOR 1 DOSE, Disp: , Rfl: 3   furosemide (LASIX) 20 MG tablet, Take 1 tablet (20 mg total) by mouth daily as needed for edema., Disp: 30 tablet, Rfl: 3   Multiple Vitamins-Minerals (BARIATRIC MULTIVITAMINS/IRON) CAPS, Take 1 tablet by mouth daily. , Disp: , Rfl:    nystatin cream (MYCOSTATIN), Apply 1 application topically 2 (two) times daily., Disp: 30 g, Rfl: 0   butalbital-acetaminophen-caffeine (FIORICET) 50-325-40 MG tablet, Take 1 tablet by mouth every 6 (six) hours as needed for headache or migraine. (Patient not taking:  Reported on 07/31/2019), Disp: 20 tablet, Rfl: 0  No results found.  No images are attached to the encounter.   CMP Latest Ref Rng & Units 07/31/2019  Glucose 65 - 99 mg/dL 85  BUN 6 - 24 mg/dL 20  Creatinine 0.57 - 1.00 mg/dL 0.58  Sodium 134 - 144 mmol/L 140  Potassium 3.5 - 5.2 mmol/L 4.4  Chloride 96 - 106 mmol/L 103  CO2 20 - 29 mmol/L 24  Calcium 8.7 - 10.2 mg/dL 10.3(H)  Total Protein 6.0 - 8.5 g/dL 7.5  Total Bilirubin 0.0 - 1.2 mg/dL 0.4  Alkaline Phos 48 - 121 IU/L 73  AST 0 - 40 IU/L 20  ALT 0 - 32 IU/L 16   CBC Latest Ref Rng & Units 07/31/2019  WBC 3.4 - 10.8 x10E3/uL 11.1(H)  Hemoglobin 11.1 - 15.9 g/dL 16.3(H)  Hematocrit 34.0 - 46.6 % 47.1(H)  Platelets 150 - 450 x10E3/uL 242     Observation/objective: Appears in no acute distress over video visit today.  Breathing is nonlabored  Assessment and plan: Patient is a 54 year old female with leukocytosis and polycythemia likely reactive and secondary to smoking.  This is a routine follow-up visit  1.  [facility]mia: [facility]  Her prior work-up for  polycythemia vera was negative.  I am inclined to monitor this conservatively and consider phlebotomy if hematocrit is greater than 55.  2.  Leukocytosis: White count has remained stable between 14-15.  Platelet counts are normal.  Previously differential has mainly shown lymphocytosis.  However BCR ABL as well as flow cytometry testing was unremarkable.  Suspect this is secondary to smoking and will monitor conservatively.  Consider a bone marrow biopsy if there is a consistent increase in her white cell count  Follow-up instructions: CBC with differential in 6 months to be done at [facility] followed by in person visit  I discussed the assessment and treatment plan with the patient. The patient was provided an opportunity to ask questions and all were answered. The patient agreed with the plan and demonstrated an understanding of the instructions.   The patient was advised to call back or seek an in-person evaluation if the symptoms worsen or if the condition fails to improve as anticipated.    Visit Diagnosis: 1. Leukocytosis, unspecified type   2. Lymphocytosis     Dr. Randa Evens, MD, MPH [facility] Tel- [phone] 08/22/2019 12:06 PM

## 2019-08-30 ENCOUNTER — Other Ambulatory Visit: Payer: Self-pay

## 2019-08-30 ENCOUNTER — Ambulatory Visit
Admission: RE | Admit: 2019-08-30 | Discharge: 2019-08-30 | Disposition: A | Discharge status: Home / Self Care | Payer: Managed Care, Other (non HMO) | Source: Ambulatory Visit | Attending: Physician Assistant | Admitting: Physician Assistant

## 2019-08-30 DIAGNOSIS — Z1231 Encounter for screening mammogram for malignant neoplasm of breast: Secondary | ICD-10-CM | POA: Diagnosis present

## 2019-08-30 DIAGNOSIS — Z1239 Encounter for other screening for malignant neoplasm of breast: Secondary | ICD-10-CM

## 2019-08-31 ENCOUNTER — Telehealth: Payer: Self-pay

## 2019-08-31 NOTE — Telephone Encounter (Signed)
Patient advised.

## 2019-08-31 NOTE — Telephone Encounter (Signed)
-----   Message from Mar D[place], [place] sent at 08/31/2019 12:25 PM EDT ----- Normal mammogram. Repeat screening in one year.

## 2019-09-03 ENCOUNTER — Other Ambulatory Visit: Payer: Self-pay | Admitting: Physician Assistant

## 2019-09-03 DIAGNOSIS — E7800 Pure hypercholesterolemia, unspecified: Secondary | ICD-10-CM

## 2019-09-03 NOTE — Telephone Encounter (Signed)
Requested Prescriptions  Pending Prescriptions Disp Refills   atorvastatin (LIPITOR) 10 MG tablet [Pharmacy Med Name: ATORVASTATIN 10MG  TABLETS] 90 tablet 3    Sig: TAKE 1 TABLET(10 MG) BY MOUTH DAILY     Cardiovascular:  Antilipid - Statins Failed - 09/03/2019  8:07 AM      Failed - LDL in normal range and within 360 days    LDL Chol Calc (NIH)  Date Value Ref Range Status  07/31/2019 130 (H) 0 - 99 mg/dL Final         Failed - HDL in normal range and within 360 days    HDL  Date Value Ref Range Status  07/31/2019 38 (L) >39 mg/dL Final         Passed - Total Cholesterol in normal range and within 360 days    Cholesterol, Total  Date Value Ref Range Status  07/31/2019 182 100 - 199 mg/dL Final         Passed - Triglycerides in normal range and within 360 days    Triglycerides  Date Value Ref Range Status  07/31/2019 72 0 - 149 mg/dL Final         Passed - Patient is not pregnant      Passed - Valid encounter within last 12 months    Recent Outpatient Visits          1 month ago Annual physical exam   [place], Clearnce Sorrel, [place]   1 year ago Annual physical exam   [place], Clearnce Sorrel, [place]   1 year ago [place], Clearnce Sorrel, [place]   1 year ago Pre-operative clearance   [place], [place], [place]   1 year ago Acute pain of left shoulder   [facility], Clearnce Sorrel, [place]      Future Appointments            In 56 months [place], Clearnce Sorrel, PA-C [employer], [place]

## 2019-10-10 ENCOUNTER — Ambulatory Visit: Payer: Managed Care, Other (non HMO) | Admitting: Plastic Surgery

## 2019-10-10 ENCOUNTER — Other Ambulatory Visit: Payer: Self-pay

## 2019-10-10 ENCOUNTER — Encounter: Payer: Self-pay | Admitting: Plastic Surgery

## 2019-10-10 VITALS — BP 125/77 | HR 83 | Temp 98.8°F | Ht 65.0 in | Wt 183.0 lb

## 2019-10-10 DIAGNOSIS — E668 Other obesity: Secondary | ICD-10-CM

## 2019-10-10 DIAGNOSIS — M793 Panniculitis, unspecified: Secondary | ICD-10-CM | POA: Insufficient documentation

## 2019-10-10 DIAGNOSIS — N62 Hypertrophy of breast: Secondary | ICD-10-CM | POA: Insufficient documentation

## 2019-10-10 DIAGNOSIS — F17200 Nicotine dependence, unspecified, uncomplicated: Secondary | ICD-10-CM

## 2019-10-10 DIAGNOSIS — Z9884 Bariatric surgery status: Secondary | ICD-10-CM

## 2019-10-10 NOTE — Progress Notes (Signed)
Patient ID: Sarah Pham, female    DOB: [birth year], 54 y.o.   MRN: [id number]   Chief Complaint  Patient presents with  . Advice Only  . Skin Problem    The patient is a 54 y.o. female with a history of mammary hyperplasia for several years.  She also complains of panniculitis.  She has extremely large breasts causing symptoms that include the following:  Back pain in the upper and lower back, including neck pain. She pulls or pins her bra straps to provide better lift and relief of the pressure and pain. She notices relief by holding her breast up manually.  Her left shoulder hurts more than her right shoulder.  Her shoulder straps cause grooves and pain and pressure that requires padding for relief. Pain medication is sometimes required with motrin and tylenol.  Activities that are hindered by enlarged breasts include: exercise and running.  She has tried supportive clothing as well as fitted bras without improvement.  Her breasts are extremely large and fairly symmetric.  She has hyperpigmentation of the inframammary area on both sides.  The sternal to nipple distance on the right is 34 cm and the left is 34 cm.  The IMF distance is 12 cm.  She is 5 feet 5 inches tall and weighs 183 pounds.  Preoperative bra size = 38 G cup.  She would like to be a BC cup.  She has lost 100 pounds over the last 3 years.  She had a gastric sleeve in 2018.  Her weight has been stable.  She does not have diabetes.  The estimated excess breast tissue to be removed at the time of surgery = 585 grams on the left and 585 grams on the right.  Mammogram history: July and was negative.  Family history of breast cancer:  Maternal grandmother is positive for breast cancer.  Tobacco use: She is currently smoking about 5 cigarettes/day.  She complains of rashes in her pannus folds.  Her abdomen is her priority at this time.  She is willing to go to physical therapy.  She is also willing to stop smoking.   Review of Systems   Constitutional: Positive for activity change. Negative for appetite change.  HENT: Negative.   Eyes: Negative.   Respiratory: Negative.  Negative for chest tightness and shortness of breath.   Cardiovascular: Negative for leg swelling.  Gastrointestinal: Negative for abdominal distention and abdominal pain.  Endocrine: Negative.   Genitourinary: Negative.   Musculoskeletal: Positive for back pain and neck pain.  Skin: Positive for color change and rash. Negative for pallor and wound.  Neurological: Negative.   Hematological: Negative.     Past Medical History:  Diagnosis Date  . Hyperlipidemia   . Left arm weakness    due to recent RTR  . Obesity   . Sleep apnea    CPAP  . Tick bite    this caused the patient not to be able to eat meat any longer  . Wears contact lenses     Past Surgical History:  Procedure Laterality Date  . COLONOSCOPY WITH PROPOFOL N/A 03/04/2018   Procedure: COLONOSCOPY WITH BIOPSIES;  Surgeon: [name], MD;  Location: [facility];  Service: Endoscopy;  Laterality: N/A;  sleep apnea  . FOOT SURGERY    . LAPAROSCOPIC GASTRIC SLEEVE RESECTION    . NASAL SINUS SURGERY    . OOPHORECTOMY Left    right was not removed  . POLYPECTOMY N/A 03/04/2018  Procedure: POLYPECTOMY;  Surgeon: [name], MD;  Location: [facility];  Service: Endoscopy;  Laterality: N/A;  . ROTATOR CUFF REPAIR Left 12/31/2017   [facility] Dr Harlow Mares.  [facility] VAGINAL HYSTERECTOMY  1995      Current Outpatient Medications:  .  atorvastatin (LIPITOR) 10 MG tablet, TAKE 1 TABLET(10 MG) BY MOUTH DAILY, Disp: 90 tablet, Rfl: 3 .  BIOTIN PO, Take 2 Doses/Fill by mouth daily. , Disp: , Rfl:  .  Black Cohosh 540 MG CAPS, Take by mouth every morning., Disp: , Rfl:  .  butalbital-acetaminophen-caffeine (FIORICET) 50-325-40 MG tablet, Take 1 tablet by mouth every 6 (six) hours as needed for headache or migraine. (Patient not taking: Reported on 07/31/2019), Disp: 20  tablet, Rfl: 0 .  EPINEPHrine 0.3 mg/0.3 mL IJ SOAJ injection, INJ 0.3 ML IM ONCE FOR 1 DOSE, Disp: , Rfl: 3 .  furosemide (LASIX) 20 MG tablet, Take 1 tablet (20 mg total) by mouth daily as needed for edema., Disp: 30 tablet, Rfl: 3 .  Multiple Vitamins-Minerals (BARIATRIC MULTIVITAMINS/IRON) CAPS, Take 1 tablet by mouth daily. , Disp: , Rfl:  .  nystatin cream (MYCOSTATIN), Apply 1 application topically 2 (two) times daily., Disp: 30 g, Rfl: 0   Objective:   Vitals:   10/10/19 1121  BP: 125/77  Pulse: 83  Temp: 98.8 F (37.1 C)    Physical Exam Vitals and nursing note reviewed.  Constitutional:      Appearance: Normal appearance.  HENT:     Head: Normocephalic and atraumatic.  Eyes:     Extraocular Movements: Extraocular movements intact.  Cardiovascular:     Rate and Rhythm: Normal rate.     Pulses: Normal pulses.  Pulmonary:     Effort: Pulmonary effort is normal. No respiratory distress.  Abdominal:     General: Abdomen is flat. There is no distension.     Tenderness: There is no abdominal tenderness.  Skin:    General: Skin is warm.     Capillary Refill: Capillary refill takes less than 2 seconds.  Neurological:     General: No focal deficit present.     Mental Status: She is alert and oriented to person, place, and time.  Psychiatric:        Mood and Affect: Mood normal.        Behavior: Behavior normal.        Thought Content: Thought content normal.     Assessment & Plan:  Compulsive tobacco user syndrome  Extreme obesity  Status post bariatric surgery  Panniculitis  Symptomatic mammary hypertrophy  Patient is a candidate for panniculectomy.  I recommend physical therapy and I have placed the order.  She also is aware that she will have to be tobacco free for 3 months prior to any surgery.  Pictures were obtained of the patient and placed in the chart with the patient's or guardian's permission.   [place], DO

## 2019-10-26 ENCOUNTER — Other Ambulatory Visit: Payer: Self-pay | Admitting: Physician Assistant

## 2019-10-26 DIAGNOSIS — R609 Edema, unspecified: Secondary | ICD-10-CM

## 2019-11-07 ENCOUNTER — Other Ambulatory Visit: Payer: Self-pay

## 2019-11-07 ENCOUNTER — Encounter: Payer: Self-pay | Admitting: Emergency Medicine

## 2019-11-07 ENCOUNTER — Ambulatory Visit
Admission: EM | Admit: 2019-11-07 | Discharge: 2019-11-07 | Disposition: A | Discharge status: Home / Self Care | Payer: Managed Care, Other (non HMO) | Attending: Family Medicine | Admitting: Family Medicine

## 2019-11-07 DIAGNOSIS — H1032 Unspecified acute conjunctivitis, left eye: Secondary | ICD-10-CM | POA: Diagnosis not present

## 2019-11-07 MED ORDER — CIPROFLOXACIN HCL 0.3 % OP SOLN: OPHTHALMIC | 0 refills | Status: DC

## 2019-11-07 NOTE — Discharge Instructions (Signed)
No contacts.  Medication as prescribed.  Take care  Dr. Lacinda Axon

## 2019-11-07 NOTE — ED Triage Notes (Signed)
Pt c/o left eye pain, redness, watery, yellow discharge on her eye lashes ans sensitive to light. Started yesterday. She states the pain in her eye has kept her up all night. She has some redness int he right eye but states it is because she has been up all night.

## 2019-11-07 NOTE — ED Provider Notes (Signed)
[facility]    CSN: [id number] Arrival date & time: 11/07/19  1002  History   Chief Complaint Chief Complaint  Patient presents with  . Conjunctivitis   HPI  54 year old female presents with the above complaint.  Patient reports that symptoms started yesterday.  She reports left eye pain, redness, watering, discharge, and photophobia.  She is a contact lens wear.  She has removed her contact lenses and has not had any improvement.  She reports that this has interfered with sleep.  Pain 9/10 in severity.  No relieving factors.  No other associated symptoms.  No other complaints.  Past Medical History:  Diagnosis Date  . Hyperlipidemia   . Left arm weakness    due to recent RTR  . Obesity   . Sleep apnea    CPAP  . Tick bite    this caused the patient not to be able to eat meat any longer  . Wears contact lenses     Patient Active Problem List   Diagnosis Date Noted  . Panniculitis 10/10/2019  . Symptomatic mammary hypertrophy 10/10/2019  . Benign neoplasm of transverse colon   . Status post bariatric surgery 06/11/2017  . 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 OSA on CPAP 06/11/2017  . Viral warts 07/06/2014  . 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 Elevated blood pressure, situational 07/06/2014  . Carpal tunnel syndrome 07/06/2014  . COPD (chronic obstructive pulmonary disease) ([place]) 07/06/2014  . Compulsive tobacco user syndrome 07/06/2014  . Extreme obesity 07/06/2014    Past Surgical History:  Procedure Laterality Date  . COLONOSCOPY WITH PROPOFOL N/A 03/04/2018   Procedure: COLONOSCOPY WITH BIOPSIES;  Surgeon: [name], MD;  Location: [facility];  Service: Endoscopy;  Laterality: N/A;  sleep apnea  . FOOT SURGERY    . LAPAROSCOPIC GASTRIC SLEEVE RESECTION    . NASAL SINUS SURGERY    . OOPHORECTOMY Left    right was not removed  . POLYPECTOMY N/A 03/04/2018   Procedure: POLYPECTOMY;  Surgeon: [name], MD;  Location: [facility];  Service: Endoscopy;  Laterality: N/A;  . ROTATOR CUFF REPAIR Left 12/31/2017    [facility].  Marland Kitchen VAGINAL HYSTERECTOMY  1995    OB History   No obstetric history on file.      Home Medications    Prior to Admission medications   Medication Sig Start Date End Date Taking? Authorizing Provider  atorvastatin (LIPITOR) 10 MG tablet TAKE 1 TABLET(10 MG) BY MOUTH DAILY 09/03/19  Yes Burnette, Clearnce Sorrel, PA-C  BIOTIN PO Take 2 Doses/Fill by mouth daily.    Yes [provider]  Black Cohosh 540 MG CAPS Take by mouth every morning.   Yes [provider]  EPINEPHrine 0.3 mg/0.3 mL IJ SOAJ injection INJ 0.3 ML IM ONCE FOR 1 DOSE 06/11/17  Yes [provider]  furosemide (LASIX) 20 MG tablet TAKE 1 TABLET(20 MG) BY MOUTH DAILY AS NEEDED FOR SWELLING 10/26/19  Yes Burnette, Clearnce Sorrel, PA-C  Multiple Vitamins-Minerals (BARIATRIC MULTIVITAMINS/IRON) CAPS Take 1 tablet by mouth daily.    Yes [provider]  ciprofloxacin (CILOXAN) 0.3 % ophthalmic solution Administer 1 drop, every 2 hours, while awake, for 2 days. Then 1 drop, every 4 hours, while awake, for the next 5 days. 11/07/19   Coral Spikes, DO  nystatin cream (MYCOSTATIN) Apply 1 application topically 2 (two) times daily. 07/31/19   Mar Daring, PA-C    Family History Family  History  Problem Relation Age of Onset  . Breast cancer Maternal Aunt   . Breast cancer Maternal Grandmother     Social History Social History   Tobacco Use  . Smoking status: Current Some Day Smoker    Packs/day: 1.00    Years: 30.00    Pack years: 30.00    Types: Cigarettes    Last attempt to quit: 10/24/2016    Years since quitting: 3.0  . Smokeless tobacco: Never Used  . Tobacco comment: 15 cig in a week  Vaping Use  . Vaping Use: Never used  Substance Use Topics  . Alcohol use: No    Alcohol/week: 0.0 standard drinks  . Drug use: No      Allergies   Alpha-gal, Galactose, Grape seed, Other, Sulfa antibiotics, Sulfasalazine, and Tomato   Review of Systems Review of Systems  Constitutional: Negative.   Eyes: Positive for photophobia, discharge, redness and itching.   Physical Exam Triage Vital Signs ED Triage Vitals  Enc Vitals Group     BP 11/07/19 1123 118/84     Pulse Rate 11/07/19 1123 67     Resp 11/07/19 1123 18     Temp 11/07/19 1123 98.2 F (36.8 C)     Temp Source 11/07/19 1123 Oral     SpO2 11/07/19 1123 100 %     Weight 11/07/19 1119 182 lb 15.7 oz (83 kg)     Height 11/07/19 1119 5\' 5"  (1.651 m)     Head Circumference --      Peak Flow --      Pain Score 11/07/19 1119 9     Pain Loc --      Pain Edu? --      Excl. in [place]? --    Updated Vital Signs BP 118/84 (BP Location: Left Arm)   Pulse 67   Temp 98.2 F (36.8 C) (Oral)   Resp 18   Ht 5\' 5"  (1.651 m)   Wt 83 kg   SpO2 100%   BMI 30.45 kg/m   Visual Acuity Right Eye Distance:   Left Eye Distance:   Bilateral Distance:    Right Eye Near:   Left Eye Near:    Bilateral Near:     Physical Exam Vitals and nursing note reviewed.  Constitutional:      General: She is not in acute distress.    Appearance: Normal appearance. She is not ill-appearing.  HENT:     Head: Normocephalic and atraumatic.  Eyes:     Comments: Left eye with conjunctival injection.  No current drainage/discharge.  Cardiovascular:     Rate and Rhythm: Normal rate and regular rhythm.  Pulmonary:     Effort: Pulmonary effort is normal. No respiratory distress.  Neurological:     Mental Status: She is alert.  Psychiatric:        Mood and Affect: Mood normal.        Behavior: Behavior normal.    UC Treatments / Results  Labs (all labs ordered are listed, but only abnormal results are displayed) Labs Reviewed - No data to display  EKG   Radiology No results found.  Procedures Procedures (including critical care time)  Medications Ordered in  UC Medications - No data to display  Initial Impression / Assessment and Plan / UC Course  I have reviewed the triage vital signs and the nursing notes.  Pertinent labs & imaging results that were available during my care of the patient were reviewed by  me and considered in my medical decision making (see chart for details).    54 year old female presents with conjunctivitis.  Patient is a contact lens wear.  Placing on Cipro op.  Final Clinical Impressions(s) / UC Diagnoses   Final diagnoses:  Acute bacterial conjunctivitis of left eye     Discharge Instructions     No contacts.  Medication as prescribed.  Take care  Dr. Lacinda Axon    ED Prescriptions    Medication Sig Dispense Auth. Provider   ciprofloxacin (CILOXAN) 0.3 % ophthalmic solution Administer 1 drop, every 2 hours, while awake, for 2 days. Then 1 drop, every 4 hours, while awake, for the next 5 days. 10 mL Coral Spikes, DO     PDMP not reviewed this encounter.   Coral Spikes, [place] 11/07/19 1743

## 2019-12-05 ENCOUNTER — Telehealth: Payer: Self-pay

## 2019-12-05 NOTE — Telephone Encounter (Signed)
Called patient and advised that appeal form is ready for pick up. Reports that she is going to pick it up tomorrow.

## 2019-12-18 ENCOUNTER — Ambulatory Visit: Payer: Managed Care, Other (non HMO) | Admitting: Surgical

## 2019-12-18 ENCOUNTER — Encounter: Payer: Self-pay | Admitting: Surgical

## 2019-12-18 ENCOUNTER — Other Ambulatory Visit: Payer: Self-pay

## 2019-12-18 VITALS — BP 121/67 | HR 65 | Temp 98.4°F | Ht 65.0 in | Wt 188.6 lb

## 2019-12-18 DIAGNOSIS — M793 Panniculitis, unspecified: Secondary | ICD-10-CM

## 2019-12-18 DIAGNOSIS — N62 Hypertrophy of breast: Secondary | ICD-10-CM

## 2019-12-18 DIAGNOSIS — F17200 Nicotine dependence, unspecified, uncomplicated: Secondary | ICD-10-CM | POA: Diagnosis not present

## 2019-12-18 DIAGNOSIS — E668 Other obesity: Secondary | ICD-10-CM

## 2019-12-18 DIAGNOSIS — Z9884 Bariatric surgery status: Secondary | ICD-10-CM | POA: Diagnosis not present

## 2019-12-18 NOTE — Progress Notes (Signed)
Subjective:     Patient ID: Sarah Pham, female    DOB: Dec 23, 1965, 54 y.o.   MRN: [id number]  Chief Complaint  Patient presents with  . Follow-up    HPI: The patient is a 54 y.o. female here for follow-up after completion of physical therapy for back pain.  Patient originally presented to see Dr. Marla Roe in consultation for macromastia and consultation of her abdomen.  Patient reports no improvement from physical therapy in regards to her upper and mid back pain.  She does report some improvement in posture.  She continues to have rashes beneath her pannus folds as well as her breast folds.  She continues to have shoulder grooving.  She reports that she is continue to apply the cream prescribed by her PCP.  She has also been using deodorant within the folds to help with odor.  She is still interested in pursuing surgical intervention for her bilateral breasts and abdominal pannus folds.  Patient also has some questions about the surgical procedure and what they involved.  She quit smoking late September and has been smoke-free for approximately 1 month now.  Review of Systems  Constitutional: Negative.   Musculoskeletal: Positive for back pain.  Skin: Positive for rash.     Objective:   Vital Signs BP 121/67 (BP Location: Right Arm, Patient Position: Sitting, Cuff Size: Large)   Pulse 65   Temp 98.4 F (36.9 C) (Oral)   Ht 5\' 5"  (1.651 m)   Wt 188 lb 9.6 oz (85.5 kg)   SpO2 97%   BMI 31.38 kg/m  Vital Signs and Nursing Note Reviewed Physical Exam Constitutional:      General: She is not in acute distress.    Appearance: Normal appearance. She is obese. She is not ill-appearing.  HENT:     Head: Normocephalic and atraumatic.  Pulmonary:     Effort: Pulmonary effort is normal.  Skin:    General: Skin is warm and dry.  Neurological:     General: No focal deficit present.     Mental Status: She is alert and oriented to person, place, and time.  Psychiatric:         Mood and Affect: Mood normal.        Behavior: Behavior normal.     Assessment/Plan:     ICD-10-CM   1. Compulsive tobacco user syndrome  F17.200   2. Panniculitis  M79.3   3. Extreme obesity  E66.8   4. Status post bariatric surgery  Z98.84   5. Symptomatic mammary hypertrophy  N62    Patient is a 54 year old female here for follow-up after completion of physical therapy for back pain.  She reports no improvement in her back pain, but does have some improved posture.  She continues to have rashes beneath her breast fold and abdominal pannus fold.  She has continued to use prescribed creams to help with rashes beneath the folds.  She continues to have shoulder grooving from her bra straps.  She is interested in pursuing breast reduction and panniculectomy surgery.  We discussed the panniculectomy in detail today.  I discussed with the patient that a panniculectomy addressed the overhanging abdominal fold and not the upper abdomen.  I discussed with the patient what this involved and she would like a quote for the cosmetic portion which would include the upper abdomen and liposuction.  We will resubmit to insurance for bilateral breast reduction and panniculectomy.  Removal of the excess breast tissue and excess  abdominal pannus tissue would provide the patient benefit.   Recommend calling with questions or concerns.  I discussed with the patient that we would update her once we received response from her insurance company.  We will also send her a quote for the cosmetic portion.   Carola Rhine Delana Manganello, PA-C 12/18/2019, 2:28 PM

## 2020-01-10 DIAGNOSIS — R1013 Epigastric pain: Secondary | ICD-10-CM | POA: Insufficient documentation

## 2020-02-22 ENCOUNTER — Telehealth: Payer: Self-pay | Admitting: *Deleted

## 2020-02-22 NOTE — Telephone Encounter (Signed)
Per pt request to cx MD appt sched for 02/27/20. Pt stated that she would contact the office back at a later date to have appt R/S

## 2020-02-27 ENCOUNTER — Inpatient Hospital Stay: Payer: Managed Care, Other (non HMO) | Admitting: Oncology

## 2020-03-08 ENCOUNTER — Telehealth: Payer: Self-pay

## 2020-03-08 DIAGNOSIS — R609 Edema, unspecified: Secondary | ICD-10-CM

## 2020-03-08 MED ORDER — FUROSEMIDE 20 MG PO TABS: ORAL_TABLET | ORAL | 0 refills | Status: DC

## 2020-03-08 NOTE — Telephone Encounter (Signed)
[facility] faxed refill request for the following medications:   furosemide (LASIX) 20 MG tablet   Please advise.  

## 2020-03-12 ENCOUNTER — Encounter: Payer: Self-pay | Admitting: Adult Health

## 2020-03-12 ENCOUNTER — Other Ambulatory Visit: Payer: Self-pay

## 2020-03-12 ENCOUNTER — Telehealth (INDEPENDENT_AMBULATORY_CARE_PROVIDER_SITE_OTHER): Payer: Managed Care, Other (non HMO) | Admitting: Adult Health

## 2020-03-12 DIAGNOSIS — Z20822 Contact with and (suspected) exposure to covid-19: Secondary | ICD-10-CM | POA: Insufficient documentation

## 2020-03-12 DIAGNOSIS — J069 Acute upper respiratory infection, unspecified: Secondary | ICD-10-CM | POA: Diagnosis not present

## 2020-03-12 NOTE — Progress Notes (Signed)
MyChart Video Visit    Virtual Visit via Video Note   This visit type was conducted due to national recommendations for restrictions regarding the COVID-19 Pandemic (e.g. social distancing) in an effort to limit this patient's exposure and mitigate transmission in our community. This patient is at least at moderate risk for complications without adequate follow up. This format is felt to be most appropriate for this patient at this time. Physical exam was limited by quality of the video and audio technology used for the visit.   Parties involved in visit as below:    Patient location: at home  Provider location: Provider: Provider's office at  [facility], [place].     I discussed the limitations of evaluation and management by telemedicine and the availability of in person appointments. The patient expressed understanding and agreed to proceed.  Patient: Sarah Pham   DOB: 1965/09/09   55 y.o. Female  MRN: [id number] Visit Date: 03/12/2020  Today's healthcare provider: Marcille Buffy, FNP   Chief Complaint  Patient presents with  . Fever   Subjective    Fever  This is a new problem. The current episode started today. The maximum temperature noted was 100 to 100.9 F. Associated symptoms include congestion and headaches. Pertinent negatives include no abdominal pain, chest pain, coughing, diarrhea, ear pain, muscle aches, nausea, rash, sleepiness, sore throat, urinary pain, vomiting or wheezing. She has tried acetaminophen for the symptoms.  Risk factors: sick contacts (spouse tested negative for Covid and diagosed with sinus infection)   Risk factors: no recent travel     She just started with fever in the middle of the night last night, was 100.1 last night.  She feels fatigued.  Her husband was negative for Covid. He was diagnosed with sinus infection.   She has chest congestion in her throat, yellow x 1 sputum.  She quit smoking September  17th 2021.Slight headache.  Denies any history of strep, no exudate reported.   Patient  denies any fever, body aches,chills, rash, chest pain, shortness of breath, nausea, vomiting, or diarrhea.   She is set for surgery February 21 st and having skin revisio due to weight loss surgery for weight loss.   Denies dizziness, lightheadedness, pre syncopal or syncopal episodes.      Patient Active Problem List   Diagnosis Date Noted  . Suspected COVID-19 virus infection 03/12/2020  . Viral upper respiratory tract infection 03/12/2020  . Panniculitis 10/10/2019  . Symptomatic mammary hypertrophy 10/10/2019  . Benign neoplasm of transverse colon   . Status post bariatric surgery 06/11/2017  . 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 OSA on CPAP 06/11/2017  . Viral warts 07/06/2014  . 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 Elevated blood pressure, situational 07/06/2014  . Carpal tunnel syndrome 07/06/2014  . COPD (chronic obstructive pulmonary disease) ([place]) 07/06/2014  . Compulsive tobacco user syndrome 07/06/2014  . Extreme obesity 07/06/2014   Past Medical History:  Diagnosis Date  . Hyperlipidemia   . Left arm weakness    due to recent RTR  . Obesity   . Sleep apnea    CPAP  . Tick bite    this caused the patient not to be able to eat meat any longer  . Wears contact lenses    Allergies  Allergen Reactions  . Alpha-Gal Anaphylaxis  . Galactose Anaphylaxis  . Grape Seed Anaphylaxis  . Other Other (See Comments)    Tomatoes, dust and mold. Patient  has Alpha Gal- Allergic to all hooved animal  . Sulfa Antibiotics Nausea And Vomiting  . Sulfasalazine Nausea And Vomiting  . Tomato Itching    Itchy throat      Medications: Outpatient Medications Prior to Visit  Medication Sig  . atorvastatin (LIPITOR) 10 MG tablet TAKE 1 TABLET(10 MG) BY MOUTH DAILY  . BIOTIN PO Take 2 Doses/Fill by mouth daily.   . furosemide (LASIX) 20 MG tablet TAKE  1 TABLET(20 MG) BY MOUTH DAILY AS NEEDED FOR SWELLING  . Multiple Vitamins-Minerals (BARIATRIC MULTIVITAMINS/IRON) CAPS Take 1 tablet by mouth daily.   Marland Kitchen nystatin cream (MYCOSTATIN) Apply 1 application topically 2 (two) times daily.  Marland Kitchen EPINEPHrine 0.3 mg/0.3 mL IJ SOAJ injection INJ 0.3 ML IM ONCE FOR 1 DOSE (Patient not taking: Reported on 03/12/2020)  . [DISCONTINUED] Black Cohosh 540 MG CAPS Take by mouth every morning.  . [DISCONTINUED] ciprofloxacin (CILOXAN) 0.3 % ophthalmic solution Administer 1 drop, every 2 hours, while awake, for 2 days. Then 1 drop, every 4 hours, while awake, for the next 5 days.   No facility-administered medications prior to visit.    Review of Systems  Constitutional: Positive for chills and fever. Negative for appetite change.  HENT: Positive for congestion. Negative for ear pain and sore throat.   Respiratory: Negative for cough and wheezing.   Cardiovascular: Negative for chest pain.  Gastrointestinal: Negative for abdominal pain, diarrhea, nausea and vomiting.  Genitourinary: Negative for dysuria.  Musculoskeletal: Negative.   Skin: Negative for rash.  Neurological: Positive for headaches.    {Labs  Heme  Chem  Endocrine  Serology  Results Review (optional):[id number]::" "}  Objective    There were no vitals taken for this visit.   Physical Exam     Patient is alert and oriented and responsive to questions Engages in conversation with provider. Speaks in full sentences without any pauses without any shortness of breath or distress.    Assessment & Plan     Viral upper respiratory tract infection  Suspected COVID-19 virus infection   Patient with less than 24 hours of symptoms. No recent illness.  Monitor symptoms, she is advised to covid test in next two days. Rest and symptomatic treatment. Strict return precautions. She does not have transportation for covid testing today she will call back 03/13/2020 if sh would like testing ordered.    Return in about 5 years (around 03/12/2025), or if symptoms worsen or fail to improve, for at any time for any worsening symptoms, Go to Emergency room/ urgent care if worse.     I discussed the assessment and treatment plan with the patient. The patient was provided an opportunity to ask questions and all were answered. The patient agreed with the plan and demonstrated an understanding of the instructions.   The patient was advised to call back or seek an in-person evaluation if the symptoms worsen or if the condition fails to improve as anticipated.  I provided 30  minutes of non-face-to-face time during this encounter.  Red Flags discussed. The patient was given clear instructions to go to ER or return to medical center if any red flags develop, symptoms do not improve, worsen or new problems develop. They verbalized understanding.  The entirety of the information documented in the History of Present Illness, Review of Systems and Physical Exam were personally obtained by me. Portions of this information were initially documented by the CMA and reviewed by me for thoroughness and accuracy.    I discussed  the limitations of evaluation and management by telemedicine and the availability of in person appointments. The patient expressed understanding and agreed to proceed.  Marcille Buffy, [place] [phone] (phone) [phone] (fax)  [place]

## 2020-03-12 NOTE — Patient Instructions (Signed)
Upper Respiratory Infection, Adult An upper respiratory infection (URI) affects the nose, throat, and upper air passages. URIs are caused by germs (viruses). The most common type of URI is often called "the common cold." Medicines cannot cure URIs, but you can do things at home to relieve your symptoms. URIs usually get better within 7-10 days. Follow these instructions at home: Activity  Rest as needed.  If you have a fever, stay home from work or school until your fever is gone, or until your doctor says you may return to work or school. ? You should stay home until you cannot spread the infection anymore (you are not contagious). ? Your doctor may have you wear a face mask so you have less risk of spreading the infection. Relieving symptoms  Gargle with a salt-water mixture 3-4 times a day or as needed. To make a salt-water mixture, completely dissolve -1 tsp of salt in 1 cup of warm water.  Use a cool-mist humidifier to add moisture to the air. This can help you breathe more easily. Eating and drinking  Drink enough fluid to keep your pee (urine) pale yellow.  Eat soups and other clear broths.   General instructions  Take over-the-counter and prescription medicines only as told by your doctor. These include cold medicines, fever reducers, and cough suppressants.  Do not use any products that contain nicotine or tobacco. These include cigarettes and e-cigarettes. If you need help quitting, ask your doctor.  Avoid being where people are smoking (avoid secondhand smoke).  Make sure you get regular shots and get the flu shot every year.  Keep all follow-up visits as told by your doctor. This is important.   How to avoid spreading infection to others  Wash your hands often with soap and water. If you do not have soap and water, use hand sanitizer.  Avoid touching your mouth, face, eyes, or nose.  Cough or sneeze into a tissue or your sleeve or elbow. Do not cough or sneeze into  your hand or into the air.   Contact a doctor if:  You are getting worse, not better.  You have any of these: ? A fever. ? Chills. ? Brown or red mucus in your nose. ? Yellow or brown fluid (discharge)coming from your nose. ? Pain in your face, especially when you bend forward. ? Swollen neck glands. ? Pain with swallowing. ? White areas in the back of your throat. Get help right away if:  You have shortness of breath that gets worse.  You have very bad or constant: ? Headache. ? Ear pain. ? Pain in your forehead, behind your eyes, and over your cheekbones (sinus pain). ? Chest pain.  You have long-lasting (chronic) lung disease along with any of these: ? Wheezing. ? Long-lasting cough. ? Coughing up blood. ? A change in your usual mucus.  You have a stiff neck.  You have changes in your: ? Vision. ? Hearing. ? Thinking. ? Mood. Summary  An upper respiratory infection (URI) is caused by a germ called a virus. The most common type of URI is often called "the common cold."  URIs usually get better within 7-10 days.  Take over-the-counter and prescription medicines only as told by your doctor. This information is not intended to replace advice given to you by your health care provider. Make sure you discuss any questions you have with your health care provider. Document Revised: 10/19/2019 Document Reviewed: 10/19/2019 Elsevier Patient Education  [place]. Fever,  Adult     A fever is an increase in your body's temperature. It often means a temperature of 100.37F (38C) or higher. Brief mild or moderate fevers often have no long-term effects. They often do not need treatment. Moderate or high fevers may make you feel uncomfortable. Sometimes, they can be a sign of a serious illness or disease. A fever that keeps coming back or that lasts a long time may cause you to lose water in your body (get dehydrated). You can take your temperature with a thermometer to  see if you have a fever. Temperature can change with:  Age.  Time of day.  Where the thermometer is put in the body. Readings may vary when the thermometer is put: ? In the mouth (oral). ? In the butt (rectal). ? In the ear (tympanic). ? Under the arm (axillary). ? On the forehead (temporal). Follow these instructions at home: Medicines  Take over-the-counter and prescription medicines only as told by your doctor. Follow the dosing instructions carefully.  If you were prescribed an antibiotic medicine, take it as told by your doctor. Do not stop taking it even if you start to feel better. General instructions  Watch for any changes in your symptoms. Tell your doctor about them.  Rest as needed.  Drink enough fluid to keep your pee (urine) pale yellow.  Sponge yourself or bathe with room-temperature water as needed. This helps to lower your body temperature. Do not use ice water.  Do not use too many blankets or wear clothes that are too heavy.  If your fever was caused by an infection that spreads from person to person (is contagious), such as a cold or the flu: ? You should stay home from work and public places for at least 24 hours after your fever is gone. ? Your fever should be gone for at least 24 hours without the need to use medicines. Contact a doctor if:  You throw up (vomit).  You cannot eat or drink without throwing up.  You have watery poop (diarrhea).  It hurts when you pee.  Your symptoms do not get better with treatment.  You have new symptoms.  You feel very weak. Get help right away if:  You are short of breath or have trouble breathing.  You are dizzy or you pass out (faint).  You feel mixed up (confused).  You have signs of not having enough water in your body, such as: ? Dark pee, very little pee, or no pee. ? Cracked lips. ? Dry mouth. ? Sunken eyes. ? Sleepiness. ? Weakness.  You have very bad pain in your belly (abdomen).  You  keep throwing up or having watery poop.  You have a rash on your skin.  Your symptoms get worse all of a sudden. Summary  A fever is an increase in your body's temperature. It often means a temperature of 100.37F (38C) or higher.  Watch for any changes in your symptoms. Tell your doctor about them.  Take all medicines only as told by your doctor.  Do not go to work or other public places if your fever was caused by an illness that can spread to other people.  Get help right away if you have signs that you do not have enough water in your body. This information is not intended to replace advice given to you by your health care provider. Make sure you discuss any questions you have with your health care provider. Document Revised: 07/26/2017  Document Reviewed: 07/26/2017 Elsevier Patient Education  2021 Elsevier Inc.  

## 2020-03-13 ENCOUNTER — Other Ambulatory Visit: Payer: Self-pay

## 2020-03-13 ENCOUNTER — Ambulatory Visit
Admission: EM | Admit: 2020-03-13 | Discharge: 2020-03-13 | Disposition: A | Discharge status: Home / Self Care | Payer: Managed Care, Other (non HMO) | Attending: Sports Medicine | Admitting: Sports Medicine

## 2020-03-13 DIAGNOSIS — U071 COVID-19: Secondary | ICD-10-CM | POA: Insufficient documentation

## 2020-03-13 DIAGNOSIS — Z9884 Bariatric surgery status: Secondary | ICD-10-CM | POA: Insufficient documentation

## 2020-03-13 DIAGNOSIS — F17210 Nicotine dependence, cigarettes, uncomplicated: Secondary | ICD-10-CM | POA: Diagnosis not present

## 2020-03-13 DIAGNOSIS — Z90721 Acquired absence of ovaries, unilateral: Secondary | ICD-10-CM | POA: Diagnosis not present

## 2020-03-13 DIAGNOSIS — Z79899 Other long term (current) drug therapy: Secondary | ICD-10-CM | POA: Diagnosis not present

## 2020-03-13 DIAGNOSIS — R5081 Fever presenting with conditions classified elsewhere: Secondary | ICD-10-CM | POA: Diagnosis not present

## 2020-03-13 DIAGNOSIS — J069 Acute upper respiratory infection, unspecified: Secondary | ICD-10-CM

## 2020-03-13 DIAGNOSIS — M791 Myalgia, unspecified site: Secondary | ICD-10-CM | POA: Diagnosis not present

## 2020-03-13 DIAGNOSIS — Z882 Allergy status to sulfonamides status: Secondary | ICD-10-CM | POA: Diagnosis not present

## 2020-03-13 NOTE — Discharge Instructions (Addendum)
We will go ahead and get a COVID test.  Send that to the hospital.  It would take 6 to 24 hours to return.  In the interim she needs to isolate.  If the test is negative she can return to work with use of a mask but since she is working from home she can just do supportive care.  If she is positive someone will call her and update her quarantine requirements and give her an updated work note. Over-the-counter meds as needed, Tylenol or Motrin for fever or discomfort.  Mucinex for congestion.  Delsym or Robitussin for cough.  Plenty of rest plenty of fluids. Educational handout provided. Red flag signs and symptoms were discussed in detail when to seek out immediate medical attention.  She voiced verbal understanding. I gave her a work note. Follow-up here as needed.

## 2020-03-13 NOTE — ED Provider Notes (Signed)
[facility]    CSN: [id number] Arrival date & time: 03/13/20  1731      History   Chief Complaint Chief Complaint  Patient presents with  . Fever    HPI Sarah Pham is a 55 y.o. female.   55 year old female who presents for evaluation of 3 days of COVID-like symptoms.  She reports that her symptoms began with some sneezing but progressed yesterday to fevers and chills.  T-max 101.6.  Today her temperature increased to 102.  She has associated body aches and headache with lightheadedness.  She also has a nonproductive dry cough.  No sore throat chest pain or shortness of breath.  Been using Mucinex Tylenol and Motrin with limited success.  She also notes decreased appetite with loss of taste and smell.  She has had COVID-vaccine x2 but no booster.  No influenza shot yet.  No COVID exposure or COVID history.  No red flag signs or symptoms offered on history.  She works from home and she works for [employer].       Past Medical History:  Diagnosis Date  . Hyperlipidemia   . Left arm weakness    due to recent RTR  . Obesity   . Sleep apnea    CPAP  . Tick bite    this caused the patient not to be able to eat meat any longer  . Wears contact lenses     Patient Active Problem List   Diagnosis Date Noted  . Suspected COVID-19 virus infection 03/12/2020  . Viral upper respiratory tract infection 03/12/2020  . Panniculitis 10/10/2019  . Symptomatic mammary hypertrophy 10/10/2019  . Benign neoplasm of transverse colon   . Status post bariatric surgery 06/11/2017  . 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 OSA on CPAP 06/11/2017  . Viral warts 07/06/2014  . 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 Elevated blood pressure, situational 07/06/2014  . Carpal tunnel syndrome 07/06/2014  . COPD (chronic obstructive pulmonary disease) ([place]) 07/06/2014  . Compulsive tobacco user syndrome 07/06/2014  . Extreme obesity 07/06/2014    Past  Surgical History:  Procedure Laterality Date  . COLONOSCOPY WITH PROPOFOL N/A 03/04/2018   Procedure: COLONOSCOPY WITH BIOPSIES;  Surgeon: [name], MD;  Location: [place];  Service: Endoscopy;  Laterality: N/A;  sleep apnea  . FOOT SURGERY    . LAPAROSCOPIC GASTRIC SLEEVE RESECTION    . NASAL SINUS SURGERY    . OOPHORECTOMY Left    right was not removed  . POLYPECTOMY N/A 03/04/2018   Procedure: POLYPECTOMY;  Surgeon: [name], MD;  Location: [place];  Service: Endoscopy;  Laterality: N/A;  . ROTATOR CUFF REPAIR Left 12/31/2017   [facility] Dr Harlow Mares.  Marland Kitchen VAGINAL HYSTERECTOMY  1995    OB History   No obstetric history on file.      Home Medications    Prior to Admission medications   Medication Sig Start Date End Date Taking? Authorizing Provider  atorvastatin (LIPITOR) 10 MG tablet TAKE 1 TABLET(10 MG) BY MOUTH DAILY 09/03/19  Yes Burnette, Clearnce Sorrel, PA-C  BIOTIN PO Take 2 Doses/Fill by mouth daily.    Yes [provider]  EPINEPHrine 0.3 mg/0.3 mL IJ SOAJ injection  06/11/17  Yes [provider]  furosemide (LASIX) 20 MG tablet TAKE 1 TABLET(20 MG) BY MOUTH DAILY AS NEEDED FOR SWELLING 03/08/20  Yes Burnette, Clearnce Sorrel, PA-C  Multiple Vitamins-Minerals (BARIATRIC MULTIVITAMINS/IRON) CAPS Take 1 tablet by mouth daily.  Yes [provider]  nystatin cream (MYCOSTATIN) Apply 1 application topically 2 (two) times daily. 07/31/19  Yes Mar Daring, PA-C    Family History Family History  Problem Relation Age of Onset  . Breast cancer Maternal Aunt   . Breast cancer Maternal Grandmother     Social History Social History   Tobacco Use  . Smoking status: Current Some Day Smoker    Packs/day: 1.00    Years: 30.00    Pack years: 30.00    Types: Cigarettes    Last attempt to quit: 10/24/2016    Years since quitting: 3.3  . Smokeless tobacco: Never Used  . Tobacco comment: 15 cig in a week  Vaping Use   . Vaping Use: Never used  Substance Use Topics  . Alcohol use: No    Alcohol/week: 0.0 standard drinks  . Drug use: No     Allergies   Alpha-gal, Galactose, Grape seed, Other, Sulfa antibiotics, Sulfasalazine, and Tomato   Review of Systems Review of Systems  Constitutional: Positive for appetite change, chills, fatigue and fever. Negative for activity change and diaphoresis.  HENT: Positive for congestion and sneezing. Negative for ear discharge, ear pain, sinus pressure, sinus pain and sore throat.   Eyes: Negative for pain.  Respiratory: Positive for cough. Negative for shortness of breath.   Cardiovascular: Negative for chest pain and palpitations.  Gastrointestinal: Negative for abdominal pain.  Genitourinary: Negative for dysuria.  Musculoskeletal: Positive for myalgias.  Skin: Negative for color change, pallor, rash and wound.  Neurological: Positive for light-headedness and headaches. Negative for dizziness.  All other systems reviewed and are negative.    Physical Exam Triage Vital Signs ED Triage Vitals  Enc Vitals Group     BP 03/13/20 1909 138/87     Pulse Rate 03/13/20 1909 70     Resp 03/13/20 1909 18     Temp 03/13/20 1909 98.1 F (36.7 C)     Temp Source 03/13/20 1909 Oral     SpO2 03/13/20 1909 100 %     Weight 03/13/20 1906 188 lb 7.9 oz (85.5 kg)     Height 03/13/20 1906 5\' 5"  (1.651 m)     Head Circumference --      Peak Flow --      Pain Score 03/13/20 1905 6     Pain Loc --      Pain Edu? --      Excl. in [place]? --    No data found.  Updated Vital Signs BP 138/87 (BP Location: Left Arm)   Pulse 70   Temp 98.1 F (36.7 C) (Oral)   Resp 18   Ht 5\' 5"  (1.651 m)   Wt 85.5 kg   SpO2 100%   BMI 31.37 kg/m   Visual Acuity Right Eye Distance:   Left Eye Distance:   Bilateral Distance:    Right Eye Near:   Left Eye Near:    Bilateral Near:     Physical Exam Vitals and nursing note reviewed.  Constitutional:      General: She is not  in acute distress.    Appearance: Normal appearance. She is not toxic-appearing.  HENT:     Head: Normocephalic and atraumatic.     Right Ear: Tympanic membrane normal.     Left Ear: Tympanic membrane normal.     Nose: Nose normal. No congestion or rhinorrhea.     Mouth/Throat:     Mouth: Mucous membranes are dry.  Pharynx: Posterior oropharyngeal erythema present. No oropharyngeal exudate.  Eyes:     Extraocular Movements: Extraocular movements intact.     Conjunctiva/sclera: Conjunctivae normal.     Pupils: Pupils are equal, round, and reactive to light.  Cardiovascular:     Rate and Rhythm: Normal rate and regular rhythm.     Pulses: Normal pulses.     Heart sounds: No murmur heard. Friction rub present. No gallop.   Pulmonary:     Effort: Pulmonary effort is normal. No respiratory distress.     Breath sounds: Normal breath sounds. No stridor. No wheezing, rhonchi or rales.  Musculoskeletal:     Cervical back: Normal range of motion and neck supple. No rigidity or tenderness.  Lymphadenopathy:     Cervical: Cervical adenopathy present.  Skin:    General: Skin is warm and dry.     Capillary Refill: Capillary refill takes less than 2 seconds.  Neurological:     General: No focal deficit present.     Mental Status: She is alert and oriented to person, place, and time.      UC Treatments / Results  Labs (all labs ordered are listed, but only abnormal results are displayed) Labs Reviewed  SARS CORONAVIRUS 2 (TAT 6-24 HRS)    EKG   Radiology No results found.  Procedures Procedures (including critical care time)  Medications Ordered in UC Medications - No data to display  Initial Impression / Assessment and Plan / UC Course  I have reviewed the triage vital signs and the nursing notes.  Pertinent labs & imaging results that were available during my care of the patient were reviewed by me and considered in my medical decision making (see chart for  details).  Clinical impression: 3 days of COVID-like symptoms with fever, chills, headache, body aches, and lightheadedness with a dry cough.  Treatment plan: 1.  The findings and treatment plan were discussed in detail with the patient.  Patient was in agreement. 2.  We will go ahead and get a COVID test.  Send that to the hospital.  It would take 6 to 24 hours to return.  In the interim she needs to isolate.  If the test is negative she can return to work with use of a mask but since she is working from home she can just do supportive care.  If she is positive someone will call her and update her quarantine requirements and give her an updated work note. 3.  Over-the-counter meds as needed, Tylenol or Motrin for fever or discomfort.  Mucinex for congestion.  Delsym or Robitussin for cough.  Plenty of rest plenty of fluids. 4.  Educational handout provided. 5.  Red flag signs and symptoms were discussed in detail when to seek out immediate medical attention.  She voiced verbal understanding. 6.  I gave her a work note. 7.  Follow-up here as needed.    Final Clinical Impressions(s) / UC Diagnoses   Final diagnoses:  URI with cough and congestion  Fever in other diseases  Myalgia     Discharge Instructions     We will go ahead and get a COVID test.  Send that to the hospital.  It would take 6 to 24 hours to return.  In the interim she needs to isolate.  If the test is negative she can return to work with use of a mask but since she is working from home she can just do supportive care.  If she is positive someone  will call her and update her quarantine requirements and give her an updated work note. Over-the-counter meds as needed, Tylenol or Motrin for fever or discomfort.  Mucinex for congestion.  Delsym or Robitussin for cough.  Plenty of rest plenty of fluids. Educational handout provided. Red flag signs and symptoms were discussed in detail when to seek out immediate medical attention.   She voiced verbal understanding. I gave her a work note. Follow-up here as needed.    ED Prescriptions    None     PDMP not reviewed this encounter.   Verda Cumins, MD 03/13/20 2004

## 2020-03-13 NOTE — ED Triage Notes (Signed)
Patient states that she has been having a fever, headache, fatigue and body aches. Patient is concerned that she may have covid.

## 2020-03-14 ENCOUNTER — Encounter: Payer: Self-pay | Admitting: Physician Assistant

## 2020-03-14 ENCOUNTER — Telehealth: Payer: Self-pay

## 2020-03-14 LAB — SARS CORONAVIRUS 2 (TAT 6-24 HRS): SARS Coronavirus 2: POSITIVE — AB

## 2020-03-14 NOTE — Telephone Encounter (Signed)
Can take to lessen severity: Vit C 500mg  twice daily Quercertin 250-500mg  twice daily Zinc 75-100mg  daily Melatonin 3-6 mg at bedtime Vit D3 1000-2000 IU daily Aspirin 81 mg daily with food Optional: Famotidine 20mg  daily Also can add tylenol/ibuprofen as needed for fevers and body aches May add Mucinex or Mucinex DM as needed for cough/congestion  Will complete the FMLA paperwork for a 10 day isolation once received. With her symptoms starting on 03/12/20, that is day zero. Day 10 of isolation would be 03/22/20, so 03/23/20 would be first day out. She will return to work on Monday 03/25/20. Her FMLA papers will reflect this time frame.

## 2020-03-14 NOTE — Telephone Encounter (Signed)
Received provider statement form via fax from [employer]. Form placed in Jenni's box and copy is on file with Medical Records. TNP

## 2020-03-14 NOTE — Telephone Encounter (Signed)
FYI...   Pt advised.  She states that her FMLA claim will also have her surgery on there that is for 04/19/2020.  She says her surgeon will fill out that part.  She only needs Tawanna Sat to fill out the Covid part.  Please fax to number that is on the form.   Thanks,   -Mickel Baas

## 2020-03-14 NOTE — Telephone Encounter (Signed)
Copied from [place] [id number]. Topic: General - Other >> Mar 14, 2020  8:28 AM Leward Quan A wrote: Reason for CRM: Patient called in to inform Fenton Malling that she was tested positive for Covid and that she have to have FMLA paperwork filled out and submitted for her job. The FMLA papers will be faxed over to the office and need to be completed and faxed back please. Patient would like a call back to discuss what she need to be doing during this time. Please advise Ph# [phone]

## 2020-03-19 NOTE — Telephone Encounter (Signed)
Forms completed

## 2020-03-26 ENCOUNTER — Encounter: Payer: Managed Care, Other (non HMO) | Admitting: Plastic Surgery

## 2020-04-01 ENCOUNTER — Encounter: Payer: Self-pay | Admitting: Plastic Surgery

## 2020-04-14 NOTE — Progress Notes (Signed)
ICD-10-CM   1. Panniculitis  M79.3   2. Status post bariatric surgery  Z98.84   3. Compulsive tobacco user syndrome  F17.200 Nicotine/cotinine metabolites      Patient ID: Sarah Pham, female    DOB: 06-23-1965, 55 y.o.   MRN: [id number]   History of Present Illness: Sarah Pham is a 55 y.o.  female  with a history of panniculitis.  She presents for preoperative evaluation for upcoming procedure, panniculectomy with upper abdominoplasty add-on, scheduled for 04/29/2020 with Dr. Marla Roe.  Summary from previous visit: Patient complains of panniculitis and rashes in her pannus fold.  Her abdomen is her priority at this time although she also has mammary hyperplasia.  At her last visit on 8/17 she was smoking about 5 cigarettes a day but was willing to quit.  She has had previous bariatric surgery.  Job: Office work for [employer] Significant for: HLD, sleep apnea with use of CPAP, positive Covid test March 14, 2019, alpha galactosidase deficiency  The patient has not had problems with anesthesia.   Past Medical History: Allergies: Allergies  Allergen Reactions  . Alpha-Gal Anaphylaxis  . Galactose Anaphylaxis  . Grape Seed Anaphylaxis  . Other Other (See Comments)    Tomatoes, dust and mold. 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 Sulfa Antibiotics Nausea And Vomiting  . Sulfasalazine Nausea And Vomiting  . Tomato Itching    Itchy throat    Current Medications:  Current Outpatient Medications:  .  atorvastatin (LIPITOR) 10 MG tablet, TAKE 1 TABLET(10 MG) BY MOUTH DAILY, Disp: 90 tablet, Rfl: 3 .  BIOTIN PO, Take 2 Doses/Fill by mouth daily. , Disp: , Rfl:  .  cephALEXin (KEFLEX) 500 MG capsule, Take 1 capsule (500 mg total) by mouth 4 (four) times daily for 3 days. For use AFTER Surgery, Disp: 12 capsule, Rfl: 0 .  furosemide (LASIX) 20 MG tablet, TAKE 1 TABLET(20 MG) BY MOUTH DAILY AS NEEDED FOR SWELLING, Disp: 90 tablet, Rfl: 0 .   HYDROcodone-acetaminophen (NORCO) 5-325 MG tablet, Take 1 tablet by mouth every 8 (eight) hours as needed for up to 7 days for severe pain. For use AFTER Surgery, Disp: 21 tablet, Rfl: 0 .  Multiple Vitamins-Minerals (BARIATRIC MULTIVITAMINS/IRON) CAPS, Take 1 tablet by mouth daily. , Disp: , Rfl:  .  ondansetron (ZOFRAN-ODT) 4 MG disintegrating tablet, Take 1 tablet (4 mg total) by mouth every 8 (eight) hours as needed for nausea or vomiting., Disp: 20 tablet, Rfl: 0 .  EPINEPHrine 0.3 mg/0.3 mL IJ SOAJ injection, , Disp: , Rfl: 3 .  nystatin cream (MYCOSTATIN), Apply 1 application topically 2 (two) times daily. (Patient not taking: Reported on 04/16/2020), Disp: 30 g, Rfl: 0  Past Medical Problems: Past Medical History:  Diagnosis Date  . Hyperlipidemia   . Left arm weakness    due to recent RTR  . Obesity   . Sleep apnea    CPAP  . Tick bite    this caused the patient not to be able to eat meat any longer  . Wears contact lenses     Past Surgical History: Past Surgical History:  Procedure Laterality Date  . COLONOSCOPY WITH PROPOFOL N/A 03/04/2018   Procedure: COLONOSCOPY WITH BIOPSIES;  Surgeon: [name], MD;  Location: [place];  Service: Endoscopy;  Laterality: N/A;  sleep apnea  . FOOT SURGERY    . LAPAROSCOPIC GASTRIC SLEEVE RESECTION    . NASAL SINUS  SURGERY    . OOPHORECTOMY Left    right was not removed  . POLYPECTOMY N/A 03/04/2018   Procedure: POLYPECTOMY;  Surgeon: [name], MD;  Location: [place];  Service: Endoscopy;  Laterality: N/A;  . ROTATOR CUFF REPAIR Left 12/31/2017   [facility] Dr Harlow Mares.  Marland Kitchen VAGINAL HYSTERECTOMY  1995    Social History: Social History   Socioeconomic History  . Marital status: Married    Spouse name: Not on file  . Number of children: Not on file  . Years of education: Not on file  . Highest education level: Not on file  Occupational History  . Not on file  Tobacco Use  . Smoking status:  Current Some Day Smoker    Packs/day: 1.00    Years: 30.00    Pack years: 30.00    Types: Cigarettes    Last attempt to quit: 10/24/2016    Years since quitting: 3.4  . Smokeless tobacco: Never Used  . Tobacco comment: 15 cig in a week  Vaping Use  . Vaping Use: Never used  Substance and Sexual Activity  . Alcohol use: No    Alcohol/week: 0.0 standard drinks  . Drug use: No  . Sexual activity: Yes  Other Topics Concern  . Not on file  Social History Narrative  . Not on file   Social Determinants of Health   Financial Resource Strain: Not on file  Food Insecurity: Not on file  Transportation Needs: Not on file  Physical Activity: Not on file  Stress: Not on file  Social Connections: Not on file  Intimate Partner Violence: Not on file    Family History: Family History  Problem Relation Age of Onset  . Breast cancer Maternal Aunt   . Breast cancer Maternal Grandmother     Review of Systems: Review of Systems  Constitutional: Negative for chills and fever.  HENT: Negative for congestion and sore throat.   Respiratory: Negative for cough and shortness of breath.   Cardiovascular: Negative for chest pain and palpitations.  Gastrointestinal: Negative for abdominal pain, nausea and vomiting.  Skin: Negative for itching and rash.    Physical Exam: Vital Signs BP 109/71 (BP Location: Left Arm, Patient Position: Sitting, Cuff Size: Large)   Pulse 68   Ht 5\' 5"  (1.651 m)   Wt 193 lb (87.5 kg)   SpO2 97%   BMI 32.12 kg/m  Physical Exam Vitals and nursing note reviewed.  Constitutional:      General: She is not in acute distress.    Appearance: Normal appearance. She is not ill-appearing.  HENT:     Head: Normocephalic and atraumatic.  Eyes:     Extraocular Movements: Extraocular movements intact.  Cardiovascular:     Rate and Rhythm: Normal rate and regular rhythm.     Pulses: Normal pulses.     Heart sounds: Normal heart sounds.  Pulmonary:     Effort:  Pulmonary effort is normal.     Breath sounds: Normal breath sounds. No wheezing, rhonchi or rales.  Abdominal:     General: Bowel sounds are normal.     Palpations: Abdomen is soft.     Comments: Pannus present and upper abdominal fullness  Musculoskeletal:        General: No swelling. Normal range of motion.     Cervical back: Normal range of motion.  Skin:    General: Skin is warm and dry.     Coloration: Skin is not pale.  Findings: No erythema or rash.  Neurological:     General: No focal deficit present.     Mental Status: She is alert and oriented to person, place, and time.  Psychiatric:        Mood and Affect: Mood normal.        Behavior: Behavior normal.        Thought Content: Thought content normal.        Judgment: Judgment normal.     Assessment/Plan:  Ms. Staff scheduled for panniculectomy with upper abdominoplasty add-on with Dr. Marla Roe.  Risks, benefits, and alternatives of procedure discussed, questions answered and consent obtained.    Smoking Status: Quit smoking 5 months ago; Counseling Given?  Yes Nicotine test order provided  Caprini Score: Moderate; Risk Factors include: 55 year old female, BMI > 25, and length of planned surgery. Recommendation for mechanical or pharmacological prophylaxis during surgery. Encourage early ambulation.   Pictures obtained: 10/10/2019  Post-op Rx sent to pharmacy: Norco, Zofran, Keflex  Patient was provided with the panniculectomy risks and General Surgical Risk consent document and Pain Medication Agreement prior to their appointment.  They had adequate time to read through the risk consent documents and Pain Medication Agreement. We also discussed them in person together during this preop appointment. All of their questions were answered to their satisfaction.  Recommended calling if they have any further questions.  Risk consent form and Pain Medication Agreement to be scanned into patient's chart.  The risk that  can be encountered for this procedure were discussed and include the following but not limited to these: asymmetry, fluid accumulation, firmness of the tissue, skin loss, decrease or no sensation, fat necrosis, bleeding, infection, healing delay.  Deep vein thrombosis, cardiac and pulmonary complications are risks to any procedure.  There are risks of anesthesia, changes to skin sensation and injury to nerves or blood vessels.  The muscle can be temporarily or permanently injured.  You may have an allergic reaction to tape, suture, glue, blood products which can result in skin discoloration, swelling, pain, skin lesions, poor healing.  Any of these can lead to the need for revisonal surgery or stage procedures.  Weight gain and weigh loss can also effect the long term appearance. The results are not guaranteed to last a lifetime.  Future surgery may be required.    Electronically signed by: Threasa Heads, PA-C 04/16/2020 12:30 PM

## 2020-04-16 ENCOUNTER — Ambulatory Visit (INDEPENDENT_AMBULATORY_CARE_PROVIDER_SITE_OTHER): Payer: Managed Care, Other (non HMO) | Admitting: Plastic Surgery

## 2020-04-16 ENCOUNTER — Other Ambulatory Visit: Payer: Self-pay

## 2020-04-16 ENCOUNTER — Encounter: Payer: Self-pay | Admitting: Plastic Surgery

## 2020-04-16 VITALS — BP 109/71 | HR 68 | Ht 65.0 in | Wt 193.0 lb

## 2020-04-16 DIAGNOSIS — F17200 Nicotine dependence, unspecified, uncomplicated: Secondary | ICD-10-CM

## 2020-04-16 DIAGNOSIS — Z719 Counseling, unspecified: Secondary | ICD-10-CM

## 2020-04-16 DIAGNOSIS — Z9884 Bariatric surgery status: Secondary | ICD-10-CM

## 2020-04-16 DIAGNOSIS — M793 Panniculitis, unspecified: Secondary | ICD-10-CM

## 2020-04-16 MED ORDER — CEPHALEXIN 500 MG PO CAPS: 500.0000 mg | ORAL_CAPSULE | Freq: Four times a day (QID) | ORAL | 0 refills | Status: AC

## 2020-04-16 MED ORDER — ONDANSETRON 4 MG PO TBDP: 4.0000 mg | ORAL_TABLET | Freq: Three times a day (TID) | ORAL | 0 refills | Status: DC | PRN

## 2020-04-16 MED ORDER — HYDROCODONE-ACETAMINOPHEN 5-325 MG PO TABS: 1.0000 | ORAL_TABLET | Freq: Three times a day (TID) | ORAL | 0 refills | Status: AC | PRN

## 2020-04-19 LAB — NICOTINE/COTININE METABOLITES
Cotinine: <1 ng/mL
Nicotine: <1 ng/mL

## 2020-04-23 ENCOUNTER — Encounter: Payer: Managed Care, Other (non HMO) | Admitting: Plastic Surgery

## 2020-04-24 ENCOUNTER — Encounter: Payer: Self-pay | Admitting: Physician Assistant

## 2020-04-25 ENCOUNTER — Telehealth: Payer: Self-pay

## 2020-04-25 ENCOUNTER — Other Ambulatory Visit: Payer: Self-pay | Admitting: Physician Assistant

## 2020-04-25 NOTE — Telephone Encounter (Signed)
Medication Refill - Medication: EPINEPHrine 0.3 mg/0.3 mL IJ SOAJ injection     Preferred Pharmacy (with phone number or street name):  [id number] - [place], [place] [address] AT [address] Phone:  [phone]  Fax:  [phone]       Agent: Please be advised that RX refills may take up to 3 business days. We ask that you follow-up with your pharmacy.

## 2020-04-25 NOTE — Telephone Encounter (Signed)
Requested medication (s) are due for refill today:   Yes  Requested medication (s) are on the active medication list:   Yes  Future visit scheduled:   Yes   Last ordered: 2 hrs ago by a historical provider  Clinic note:   Returned because last prescribed by a historical provider 2 yrs ago   Requested Prescriptions  Pending Prescriptions Disp Refills   EPINEPHrine 0.3 mg/0.3 mL IJ SOAJ injection 1 each 3      Immunology: Antidotes Passed - 04/25/2020  8:30 AM      Passed - Valid encounter within last 12 months    Recent Outpatient Visits           1 month ago Viral upper respiratory tract infection   [facility], Kelby Aline, FNP   8 months ago Annual physical exam   [place], Clearnce Sorrel, [place]   1 year ago Annual physical exam   [place], Clearnce Sorrel, [place]   2 years ago [place], [place], [place]   2 years ago Pre-operative clearance   [facility] [place], Clearnce Sorrel, [place]       Future Appointments             In 3 months [place], Clearnce Sorrel, PA-C [employer], [place]

## 2020-04-25 NOTE — Telephone Encounter (Signed)
Copied from [place] [phone]. Topic: General - Other >> Mar 14, 2020  8:28 AM Leward Quan A wrote: Reason for CRM: Patient called in to inform Fenton Malling that she was tested positive for Covid and that she have to have FMLA paperwork filled out and submitted for her job. The FMLA papers will be faxed over to the office and need to be completed and faxed back please. Patient would like a call back to discuss what she need to be doing during this time. Please advise Ph# [phone] >> Apr 25, 2020  8:20 AM Keene Breath wrote: Patient called to ask if the forms were faxed to her employer or should she come in to pick them up.  She is not sure if she told the office to fax the forms, but her employer needs the forms by Friday.  Please advise.

## 2020-04-26 ENCOUNTER — Encounter: Payer: Self-pay | Admitting: Physician Assistant

## 2020-04-26 ENCOUNTER — Other Ambulatory Visit: Payer: Self-pay

## 2020-04-26 MED ORDER — EPINEPHRINE 0.3 MG/0.3ML IJ SOAJ: 0.3000 mg | INTRAMUSCULAR | 3 refills | Status: DC | PRN

## 2020-04-26 NOTE — Telephone Encounter (Signed)
This message came through [place] message, it is on her list but is looks like we have not actually prescribed it for her   Anderson [place]  My Epi pen has expired and is very cloudy also my prescription is out dated so I need another prescription to get this refilled. I use [facility] [address].  I'm having surgery on Monday March 7th and wanted to get this before then in case I need.  Thank you  Sarah Pham

## 2020-04-29 DIAGNOSIS — Z411 Encounter for cosmetic surgery: Secondary | ICD-10-CM

## 2020-04-29 DIAGNOSIS — M793 Panniculitis, unspecified: Secondary | ICD-10-CM

## 2020-04-29 NOTE — Telephone Encounter (Signed)
Form was faxed on 03/20/20 and again today. LM for pt advising. TNP

## 2020-04-30 ENCOUNTER — Encounter: Payer: Managed Care, Other (non HMO) | Admitting: Plastic Surgery

## 2020-05-02 ENCOUNTER — Encounter: Payer: Self-pay | Admitting: Physician Assistant

## 2020-05-07 ENCOUNTER — Encounter: Payer: Self-pay | Admitting: Plastic Surgery

## 2020-05-07 ENCOUNTER — Ambulatory Visit (INDEPENDENT_AMBULATORY_CARE_PROVIDER_SITE_OTHER): Payer: Managed Care, Other (non HMO) | Admitting: Plastic Surgery

## 2020-05-07 ENCOUNTER — Other Ambulatory Visit: Payer: Self-pay

## 2020-05-07 VITALS — BP 107/70 | HR 71

## 2020-05-07 DIAGNOSIS — N62 Hypertrophy of breast: Secondary | ICD-10-CM

## 2020-05-07 NOTE — Progress Notes (Signed)
   Subjective:    Patient ID: Sarah Pham, female    DOB: [birth year], 55 y.o.   MRN: [id number]  The patient is a 55 year old female here for follow-up after undergoing abdominoplasty.  She has minimal output from her drains.  She has a little bit of irritation around the drain.  Hopefully removing it will help decrease that.  Her incisions appear to be intact.  There is no sign of hematoma or seroma.  No sign of infection . She brought in a girdle that we tried to put on her.  Continue drinking lots of water and increase protein and decrease carbohydrates and sugar.  Her daughters been a huge help.     Review of Systems  Constitutional: Positive for activity change. Negative for appetite change.  Eyes: Negative.   Respiratory: Negative.   Cardiovascular: Negative.   Genitourinary: Negative.        Objective:   Physical Exam Vitals and nursing note reviewed.  Constitutional:      Appearance: Normal appearance.  Cardiovascular:     Rate and Rhythm: Normal rate.     Pulses: Normal pulses.  Neurological:     Mental Status: She is alert. Mental status is at baseline.  Psychiatric:        Mood and Affect: Mood normal.        Behavior: Behavior normal.           Assessment & Plan:     ICD-10-CM   1. Symptomatic mammary hypertrophy  N62     The right drain was removed.  Continue with light activity.  I recommend a looser Spanx so it does not create any pressure injury.  I like to see her back in 1 week.  Hopefully we will be able to remove the drain.

## 2020-05-07 NOTE — Telephone Encounter (Signed)
It was faxed to [employer] on 1/26//2022 and again on 04/29/2020. TNP

## 2020-05-14 ENCOUNTER — Ambulatory Visit (INDEPENDENT_AMBULATORY_CARE_PROVIDER_SITE_OTHER): Payer: Managed Care, Other (non HMO) | Admitting: Surgical

## 2020-05-14 ENCOUNTER — Encounter: Payer: Self-pay | Admitting: Surgical

## 2020-05-14 ENCOUNTER — Encounter: Payer: Managed Care, Other (non HMO) | Admitting: Plastic Surgery

## 2020-05-14 ENCOUNTER — Other Ambulatory Visit: Payer: Self-pay

## 2020-05-14 VITALS — Ht 65.0 in

## 2020-05-14 DIAGNOSIS — M793 Panniculitis, unspecified: Secondary | ICD-10-CM

## 2020-05-14 MED ORDER — DOXYCYCLINE HYCLATE 100 MG PO TABS: 100.0000 mg | ORAL_TABLET | Freq: Two times a day (BID) | ORAL | 0 refills | Status: AC

## 2020-05-14 NOTE — Progress Notes (Signed)
Patient is a 55 year old female here for follow-up after undergoing abdominoplasty with Dr. Marla Roe.  She is 2 weeks postop.  1 week ago her right drain was moved, she still has the left drain in place.  She reports today that she is overall doing okay, reports the output has been approximately 25 cc per 24 hours. She is not having any infectious symptoms.  She has been receiving assistance at home from her daughter.  Chaperone present on exam On exam she does have some dehiscence of her midline abdominal incision.  It appears fairly superficial, however it does extend across the entire midline portion of her abdomen.  There is no purulence or foul odor is noted on exam.  She does have some surrounding erythema with some firmness and tenderness with palpation.  The umbilicus incision is intact with some drainage/scabbing noted.  There is no necrosis of the umbilicus.  Left JP drain in place with 25 cc of serosanguineous fluid in the bulb.  I do not palpate any fluctuance or areas of fluid collections on exam.  No crepitus noted with palpation.  I discussed with the patient she has midline incisional dehiscence, I recommend she continue to wear compressive garments, avoid strenuous activities.  I recommend she apply Vaseline and gauze to the entire abdominal incision wound daily or twice daily.  She is going to receive assistance from her family with this.  I did remove the left JP drain due to low output.  I am going to send the patient in a prescription of antibiotics for 1 week due to the erythema of the lower midline abdominal tissue.  Patient has a follow-up in 1 week, recommend calling with questions or concerns or if her symptoms change or worsen.

## 2020-05-17 ENCOUNTER — Telehealth: Payer: Self-pay

## 2020-05-17 NOTE — Telephone Encounter (Signed)
Patient called to state that her abdomen is feeling warm and hard bellow her belly button and there is redness present with a foul odor. She states this has worsened since yesterday and would like a call back to discuss treatment options. She has been taking her antibiotics.

## 2020-05-17 NOTE — Telephone Encounter (Signed)
Patient called to state that her abdomen feels warm and she is having some firmness below her bellybutton.  She also has noticed some redness and a foul odor from her abdominal incision.  She was seen in the office earlier this week and the redness and firmness was noted, she was started on antibiotic.  Patient's daughter who is a Marine scientist is present.  They report that they are concerned because they have noticed a foul odor which they were previously not noticing.  She also reports that they have been doing the dressing changes at home and this has been going well.  They have been covering it with Vaseline and gauze.  She is not having any fevers, chest pain, shortness of breath.  She is not having any nausea or vomiting.  She does reports feeling cold but no excessive chills.  She is taking the doxycycline as prescribed.  She feels as if the wound is a little bit worse.  She also reports that she is having some swelling in her lower extremities, reports they are equal.  Her daughter reports that it does not look as if it is consistent with a blood clot.  They are both equally swollen and they are not concerned for this.  I recommend continue with the doxycycline, continue with dressing changes.  I did discuss with them that I would be happy to see them Monday morning to evaluate.  I discussed with him that we do have an on-call service and if anything changes to please call us.  I discussed with them if her symptoms worsen or she develops any infectious symptoms or the redness, foul odors or any of her other symptoms become worse that they should be evaluated by a provider in an urgent care or emergency room setting.  They are understanding of this and in agreement with this.  She is scheduled to be seen in our office on 05/21/2020, however I can see them sooner if her symptoms do not improve over the weekend.  Patient is in agreement with this.

## 2020-05-20 ENCOUNTER — Encounter (HOSPITAL_BASED_OUTPATIENT_CLINIC_OR_DEPARTMENT_OTHER): Payer: Self-pay | Admitting: Plastic Surgery

## 2020-05-20 ENCOUNTER — Ambulatory Visit (INDEPENDENT_AMBULATORY_CARE_PROVIDER_SITE_OTHER): Payer: Managed Care, Other (non HMO) | Admitting: Surgical

## 2020-05-20 ENCOUNTER — Other Ambulatory Visit: Payer: Self-pay

## 2020-05-20 ENCOUNTER — Encounter: Payer: Self-pay | Admitting: Surgical

## 2020-05-20 VITALS — BP 136/81 | HR 79

## 2020-05-20 DIAGNOSIS — Z9884 Bariatric surgery status: Secondary | ICD-10-CM

## 2020-05-20 DIAGNOSIS — M793 Panniculitis, unspecified: Secondary | ICD-10-CM

## 2020-05-20 MED ORDER — TRAMADOL HCL 50 MG PO TABS: 50.0000 mg | ORAL_TABLET | Freq: Three times a day (TID) | ORAL | 0 refills | Status: AC | PRN

## 2020-05-20 MED ORDER — DOXYCYCLINE HYCLATE 100 MG PO TABS: 100.0000 mg | ORAL_TABLET | Freq: Two times a day (BID) | ORAL | 0 refills | Status: AC

## 2020-05-20 NOTE — Progress Notes (Signed)
Patient is a 55 year old female here for follow-up after panniculectomy with upper abdominoplasty add-on with Dr. Marla Roe on 04/29/2020.  Patient called the office on Friday reporting that she had noticed some foul odor from incision site.  She is currently on doxycycline which was prescribed for her on 05/14/2020 for erythema of the midline incisional site.   She presents today with her daughter who is an Therapist, sports.  She reports that she is having a lot of pain which she has been taking Tylenol and occasional Norco for, she reports she has 2 more doses of Norco.  She reports that she is also continuing to take the doxycycline.  Chaperone present on exam On exam the lateral abdominal incisions are intact.  She does have some dehiscence along the midline area.  Specifically she has incisional dehiscence along the right medial incision -the area is approximately 1.5 x 6.5 cm.  There is some fibrinous exudate located within the wound bed.  There is some surrounding erythema but no cellulitic changes.  The surrounding area is firm and tender to palpation.  No fluctuance or crepitus noted with palpation.  There is a foul odor noted.  There is no necrosis of the umbilicus, however she does have a small wound at the most inferior portion of the umbilicus that appears superficial.  Recommend wet-to-dry dressing changes to the right lateral incisional dehiscence.  Recommend Vaseline and gauze to the rest of the midline incision.  Recommend Vaseline and gauze to the umbilicus wound.  She does have some skin breakdown surrounding the incisional dehiscence, I suspect this is pressure related as she had a pressure garment that was fairly tight.  I recommended she loosen the garment.  I discussed with the patient that I would further discuss her situation with Dr. Marla Roe and discuss if we should continue with local wound care or possibly return to the OR for debridement and primary closure of the dehiscence.  I  prescribed her some additional pain medication to take for severe pain.  We will extend her FMLA/out of work status for 2 more weeks to allow her to heal.  I discussed with her to send me the forms and I will be happy to fill them out.  She will remain out of work due to the wound and pain.  Addendum: Discussed the plan with Dr. Marla Roe.  We then called the patient this afternoon to further discuss and patient is agreeable to return to the OR for debridement and primary closure.  We will work on setting this up.  All of her questions were answered.  Pictures were obtained of the patient and placed in the chart with the patient's or guardian's permission.

## 2020-05-21 ENCOUNTER — Encounter (HOSPITAL_BASED_OUTPATIENT_CLINIC_OR_DEPARTMENT_OTHER)
Admission: RE | Admit: 2020-05-21 | Discharge: 2020-05-21 | Disposition: A | Discharge status: Home / Self Care | Payer: Managed Care, Other (non HMO) | Source: Ambulatory Visit | Attending: Plastic Surgery | Admitting: Plastic Surgery

## 2020-05-21 ENCOUNTER — Encounter: Payer: Managed Care, Other (non HMO) | Admitting: Plastic Surgery

## 2020-05-21 ENCOUNTER — Other Ambulatory Visit: Payer: Self-pay

## 2020-05-21 DIAGNOSIS — M7989 Other specified soft tissue disorders: Secondary | ICD-10-CM | POA: Diagnosis present

## 2020-05-21 DIAGNOSIS — Z87891 Personal history of nicotine dependence: Secondary | ICD-10-CM | POA: Diagnosis not present

## 2020-05-21 DIAGNOSIS — Z79899 Other long term (current) drug therapy: Secondary | ICD-10-CM | POA: Diagnosis not present

## 2020-05-21 DIAGNOSIS — Z91018 Allergy to other foods: Secondary | ICD-10-CM | POA: Diagnosis not present

## 2020-05-21 DIAGNOSIS — Z9109 Other allergy status, other than to drugs and biological substances: Secondary | ICD-10-CM | POA: Diagnosis not present

## 2020-05-21 DIAGNOSIS — L7634 Postprocedural seroma of skin and subcutaneous tissue following other procedure: Secondary | ICD-10-CM | POA: Diagnosis not present

## 2020-05-21 DIAGNOSIS — Z882 Allergy status to sulfonamides status: Secondary | ICD-10-CM | POA: Diagnosis not present

## 2020-05-21 DIAGNOSIS — Z87892 Personal history of anaphylaxis: Secondary | ICD-10-CM | POA: Diagnosis not present

## 2020-05-21 DIAGNOSIS — Y838 Other surgical procedures as the cause of abnormal reaction of the patient, or of later complication, without mention of misadventure at the time of the procedure: Secondary | ICD-10-CM | POA: Diagnosis not present

## 2020-05-21 LAB — BASIC METABOLIC PANEL
Anion gap: 6 (ref 5–15)
BUN: 16 mg/dL (ref 6–20)
CO2: 30 mmol/L (ref 22–32)
Calcium: 10 mg/dL (ref 8.9–10.3)
Chloride: 103 mmol/L (ref 98–111)
Creatinine, Ser: 0.68 mg/dL (ref 0.44–1.00)
GFR, Estimated: >60 mL/min (ref >60)
Glucose, Bld: 72 mg/dL (ref 70–99)
Potassium: 4.9 mmol/L (ref 3.5–5.1)
Sodium: 139 mmol/L (ref 135–145)

## 2020-05-22 ENCOUNTER — Ambulatory Visit (HOSPITAL_BASED_OUTPATIENT_CLINIC_OR_DEPARTMENT_OTHER): Payer: Managed Care, Other (non HMO) | Admitting: Anesthesiology

## 2020-05-22 ENCOUNTER — Encounter (HOSPITAL_BASED_OUTPATIENT_CLINIC_OR_DEPARTMENT_OTHER): Payer: Self-pay | Admitting: Plastic Surgery

## 2020-05-22 ENCOUNTER — Other Ambulatory Visit: Payer: Self-pay

## 2020-05-22 ENCOUNTER — Ambulatory Visit (HOSPITAL_BASED_OUTPATIENT_CLINIC_OR_DEPARTMENT_OTHER)
Admission: RE | Admit: 2020-05-22 | Discharge: 2020-05-22 | Disposition: A | Discharge status: Home / Self Care | Payer: Managed Care, Other (non HMO) | Attending: Plastic Surgery | Admitting: Plastic Surgery

## 2020-05-22 ENCOUNTER — Encounter (HOSPITAL_BASED_OUTPATIENT_CLINIC_OR_DEPARTMENT_OTHER)
Admission: RE | Disposition: A | Discharge status: Home / Self Care | Payer: Self-pay | Source: Home / Self Care | Attending: Plastic Surgery

## 2020-05-22 DIAGNOSIS — Z882 Allergy status to sulfonamides status: Secondary | ICD-10-CM | POA: Insufficient documentation

## 2020-05-22 DIAGNOSIS — S31109A Unspecified open wound of abdominal wall, unspecified quadrant without penetration into peritoneal cavity, initial encounter: Secondary | ICD-10-CM

## 2020-05-22 DIAGNOSIS — Z79899 Other long term (current) drug therapy: Secondary | ICD-10-CM | POA: Insufficient documentation

## 2020-05-22 DIAGNOSIS — Y838 Other surgical procedures as the cause of abnormal reaction of the patient, or of later complication, without mention of misadventure at the time of the procedure: Secondary | ICD-10-CM | POA: Insufficient documentation

## 2020-05-22 DIAGNOSIS — L7634 Postprocedural seroma of skin and subcutaneous tissue following other procedure: Secondary | ICD-10-CM | POA: Insufficient documentation

## 2020-05-22 DIAGNOSIS — Z9109 Other allergy status, other than to drugs and biological substances: Secondary | ICD-10-CM | POA: Insufficient documentation

## 2020-05-22 DIAGNOSIS — M7989 Other specified soft tissue disorders: Secondary | ICD-10-CM | POA: Insufficient documentation

## 2020-05-22 DIAGNOSIS — Z87891 Personal history of nicotine dependence: Secondary | ICD-10-CM | POA: Insufficient documentation

## 2020-05-22 DIAGNOSIS — Z87892 Personal history of anaphylaxis: Secondary | ICD-10-CM | POA: Insufficient documentation

## 2020-05-22 DIAGNOSIS — Z91018 Allergy to other foods: Secondary | ICD-10-CM | POA: Insufficient documentation

## 2020-05-22 DIAGNOSIS — K469 Unspecified abdominal hernia without obstruction or gangrene: Secondary | ICD-10-CM | POA: Diagnosis not present

## 2020-05-22 HISTORY — PX: DEBRIDEMENT AND CLOSURE WOUND: SHX5614

## 2020-05-22 HISTORY — DX: Other specified postprocedural states: R11.2

## 2020-05-22 HISTORY — DX: Other specified postprocedural states: Z98.890

## 2020-05-22 SURGERY — DEBRIDEMENT, WOUND, WITH CLOSURE
Anesthesia: General | Site: Abdomen

## 2020-05-22 MED ORDER — OXYCODONE HCL 5 MG PO TABS: 5.0000 mg | ORAL_TABLET | Freq: Once | ORAL | Status: DC | PRN

## 2020-05-22 MED ORDER — PROPOFOL 10 MG/ML IV BOLUS
INTRAVENOUS | Status: DC | PRN
  Administered 2020-05-22: 200 mg via INTRAVENOUS

## 2020-05-22 MED ORDER — FENTANYL CITRATE (PF) 100 MCG/2ML IJ SOLN
INTRAMUSCULAR | Status: DC | PRN
  Administered 2020-05-22: 25 ug via INTRAVENOUS
  Administered 2020-05-22: 50 ug via INTRAVENOUS
  Administered 2020-05-22: 25 ug via INTRAVENOUS

## 2020-05-22 MED ORDER — SODIUM CHLORIDE 0.9 % IV SOLN: 250.0000 mL | INTRAVENOUS | Status: DC | PRN

## 2020-05-22 MED ORDER — AMISULPRIDE (ANTIEMETIC) 5 MG/2ML IV SOLN
INTRAVENOUS | Status: AC
  Filled 2020-05-22: qty 2

## 2020-05-22 MED ORDER — PROMETHAZINE HCL 25 MG/ML IJ SOLN: 6.2500 mg | INTRAMUSCULAR | Status: DC | PRN

## 2020-05-22 MED ORDER — CEFAZOLIN SODIUM-DEXTROSE 2-4 GM/100ML-% IV SOLN
2.0000 g | INTRAVENOUS | Status: AC
  Administered 2020-05-22: 2 g via INTRAVENOUS

## 2020-05-22 MED ORDER — CEFAZOLIN SODIUM-DEXTROSE 2-4 GM/100ML-% IV SOLN
INTRAVENOUS | Status: AC
  Filled 2020-05-22: qty 100

## 2020-05-22 MED ORDER — FENTANYL CITRATE (PF) 100 MCG/2ML IJ SOLN: 25.0000 ug | INTRAMUSCULAR | Status: DC | PRN

## 2020-05-22 MED ORDER — MIDAZOLAM HCL 5 MG/5ML IJ SOLN
INTRAMUSCULAR | Status: DC | PRN
  Administered 2020-05-22: 2 mg via INTRAVENOUS

## 2020-05-22 MED ORDER — LIDOCAINE-EPINEPHRINE 1 %-1:100000 IJ SOLN
INTRAMUSCULAR | Status: AC
  Filled 2020-05-22: qty 1

## 2020-05-22 MED ORDER — LACTATED RINGERS IV SOLN: INTRAVENOUS | Status: DC

## 2020-05-22 MED ORDER — DEXAMETHASONE SODIUM PHOSPHATE 10 MG/ML IJ SOLN
INTRAMUSCULAR | Status: DC | PRN
  Administered 2020-05-22: 5 mg via INTRAVENOUS

## 2020-05-22 MED ORDER — ONDANSETRON HCL 4 MG/2ML IJ SOLN
INTRAMUSCULAR | Status: DC | PRN
  Administered 2020-05-22: 4 mg via INTRAVENOUS

## 2020-05-22 MED ORDER — ACETAMINOPHEN 325 MG RE SUPP: 650.0000 mg | RECTAL | Status: DC | PRN

## 2020-05-22 MED ORDER — AMISULPRIDE (ANTIEMETIC) 5 MG/2ML IV SOLN
INTRAVENOUS | Status: DC | PRN
  Administered 2020-05-22: 5 mg via INTRAVENOUS

## 2020-05-22 MED ORDER — FENTANYL CITRATE (PF) 100 MCG/2ML IJ SOLN
INTRAMUSCULAR | Status: AC
  Filled 2020-05-22: qty 2

## 2020-05-22 MED ORDER — OXYCODONE HCL 5 MG PO TABS
5.0000 mg | ORAL_TABLET | ORAL | Status: DC | PRN
Start: 2020-05-22 — End: 2020-05-22

## 2020-05-22 MED ORDER — EPHEDRINE SULFATE 50 MG/ML IJ SOLN
INTRAMUSCULAR | Status: DC | PRN
  Administered 2020-05-22 (×2): 10 mg via INTRAVENOUS

## 2020-05-22 MED ORDER — LIDOCAINE HCL (CARDIAC) PF 100 MG/5ML IV SOSY
PREFILLED_SYRINGE | INTRAVENOUS | Status: DC | PRN
  Administered 2020-05-22: 100 mg via INTRATRACHEAL

## 2020-05-22 MED ORDER — ACETAMINOPHEN 500 MG PO TABS
1000.0000 mg | ORAL_TABLET | Freq: Once | ORAL | Status: AC
  Administered 2020-05-22: 1000 mg via ORAL

## 2020-05-22 MED ORDER — ONDANSETRON HCL 4 MG/2ML IJ SOLN
INTRAMUSCULAR | Status: AC
  Filled 2020-05-22: qty 2

## 2020-05-22 MED ORDER — OXYCODONE HCL 5 MG/5ML PO SOLN: 5.0000 mg | Freq: Once | ORAL | Status: DC | PRN

## 2020-05-22 MED ORDER — CHLORHEXIDINE GLUCONATE CLOTH 2 % EX PADS: 6.0000 | MEDICATED_PAD | Freq: Once | CUTANEOUS | Status: DC

## 2020-05-22 MED ORDER — AMISULPRIDE (ANTIEMETIC) 5 MG/2ML IV SOLN: 10.0000 mg | Freq: Once | INTRAVENOUS | Status: DC | PRN

## 2020-05-22 MED ORDER — SODIUM CHLORIDE 0.9% FLUSH: 3.0000 mL | Freq: Two times a day (BID) | INTRAVENOUS | Status: DC

## 2020-05-22 MED ORDER — FENTANYL CITRATE (PF) 100 MCG/2ML IJ SOLN
25.0000 ug | INTRAMUSCULAR | Status: DC | PRN
  Administered 2020-05-22: 50 ug via INTRAVENOUS

## 2020-05-22 MED ORDER — ACETAMINOPHEN 325 MG PO TABS: 650.0000 mg | ORAL_TABLET | ORAL | Status: DC | PRN

## 2020-05-22 MED ORDER — PROPOFOL 10 MG/ML IV BOLUS
INTRAVENOUS | Status: AC
  Filled 2020-05-22: qty 20

## 2020-05-22 MED ORDER — MIDAZOLAM HCL 2 MG/2ML IJ SOLN
INTRAMUSCULAR | Status: AC
  Filled 2020-05-22: qty 2

## 2020-05-22 MED ORDER — ACETAMINOPHEN 500 MG PO TABS
ORAL_TABLET | ORAL | Status: AC
  Filled 2020-05-22: qty 2

## 2020-05-22 MED ORDER — SODIUM CHLORIDE 0.9% FLUSH: 3.0000 mL | INTRAVENOUS | Status: DC | PRN

## 2020-05-22 SURGICAL SUPPLY — 49 items
ADH SKN CLS APL DERMABOND .7 (GAUZE/BANDAGES/DRESSINGS)
BLADE HEX COATED 2.75 (ELECTRODE) ×2 IMPLANT
BLADE SURG 10 STRL SS (BLADE) ×2 IMPLANT
BLADE SURG 15 STRL LF DISP TIS (BLADE) ×1 IMPLANT
BLADE SURG 15 STRL SS (BLADE) ×2
COVER BACK TABLE 60X90IN (DRAPES) ×2 IMPLANT
COVER MAYO STAND STRL (DRAPES) ×2 IMPLANT
COVER WAND RF STERILE (DRAPES) IMPLANT
DECANTER SPIKE VIAL GLASS SM (MISCELLANEOUS) IMPLANT
DERMABOND ADVANCED (GAUZE/BANDAGES/DRESSINGS)
DERMABOND ADVANCED .7 DNX12 (GAUZE/BANDAGES/DRESSINGS) IMPLANT
DRAIN CHANNEL 19F RND (DRAIN) IMPLANT
DRAPE SURG 17X23 STRL (DRAPES) IMPLANT
DRSG PAD ABDOMINAL 8X10 ST (GAUZE/BANDAGES/DRESSINGS) ×3 IMPLANT
ELECT BLADE 4.0 EZ CLEAN MEGAD (MISCELLANEOUS) ×2
ELECT REM PT RETURN 9FT ADLT (ELECTROSURGICAL) ×2
ELECTRODE BLDE 4.0 EZ CLN MEGD (MISCELLANEOUS) IMPLANT
ELECTRODE REM PT RTRN 9FT ADLT (ELECTROSURGICAL) ×1 IMPLANT
EVACUATOR SILICONE 100CC (DRAIN) IMPLANT
GAUZE SPONGE 4X4 12PLY STRL (GAUZE/BANDAGES/DRESSINGS) ×2 IMPLANT
GLOVE SURG ENC MOIS LTX SZ6.5 (GLOVE) ×4 IMPLANT
GOWN STRL REUS W/ TWL LRG LVL3 (GOWN DISPOSABLE) ×2 IMPLANT
GOWN STRL REUS W/TWL LRG LVL3 (GOWN DISPOSABLE) ×4
KIT PREVENA INCISION MGT 13 (CANNISTER) ×1 IMPLANT
NDL HYPO 25X1 1.5 SAFETY (NEEDLE) ×1 IMPLANT
NDL SPNL 18GX3.5 QUINCKE PK (NEEDLE) IMPLANT
NEEDLE HYPO 25X1 1.5 SAFETY (NEEDLE) ×2 IMPLANT
NEEDLE SPNL 18GX3.5 QUINCKE PK (NEEDLE) ×2 IMPLANT
NS IRRIG 1000ML POUR BTL (IV SOLUTION) ×2 IMPLANT
PACK BASIN DAY SURGERY FS (CUSTOM PROCEDURE TRAY) ×2 IMPLANT
PENCIL SMOKE EVACUATOR (MISCELLANEOUS) ×2 IMPLANT
SLEEVE SCD COMPRESS KNEE MED (STOCKING) ×2 IMPLANT
SPONGE LAP 18X18 RF (DISPOSABLE) ×4 IMPLANT
STAPLER VISISTAT 35W (STAPLE) IMPLANT
SUT ETHIBOND 0 MO6 C/R (SUTURE) ×1 IMPLANT
SUT MNCRL AB 3-0 PS2 18 (SUTURE) ×3 IMPLANT
SUT MNCRL AB 4-0 PS2 18 (SUTURE) ×1 IMPLANT
SUT MON AB 5-0 PS2 18 (SUTURE) ×1 IMPLANT
SUT SILK 3 0 PS 1 (SUTURE) ×1 IMPLANT
SWAB COLLECTION DEVICE MRSA (MISCELLANEOUS) IMPLANT
SWAB CULTURE ESWAB REG 1ML (MISCELLANEOUS) IMPLANT
SYR 20ML LL LF (SYRINGE) ×1 IMPLANT
SYR BULB IRRIG 60ML STRL (SYRINGE) IMPLANT
SYR CONTROL 10ML LL (SYRINGE) ×2 IMPLANT
TOWEL GREEN STERILE FF (TOWEL DISPOSABLE) ×4 IMPLANT
TRAY DSU PREP LF (CUSTOM PROCEDURE TRAY) ×2 IMPLANT
TUBE CONNECTING 20X1/4 (TUBING) ×2 IMPLANT
UNDERPAD 30X36 HEAVY ABSORB (UNDERPADS AND DIAPERS) ×3 IMPLANT
YANKAUER SUCT BULB TIP NO VENT (SUCTIONS) ×2 IMPLANT

## 2020-05-22 NOTE — Anesthesia Procedure Notes (Signed)
Procedure Name: LMA Insertion Date/Time: 05/22/2020 11:00 AM Performed by: Glory Buff, CRNA Pre-anesthesia Checklist: Patient identified, Emergency Drugs available, Suction available and Patient being monitored Patient Re-evaluated:Patient Re-evaluated prior to induction Oxygen Delivery Method: Circle system utilized Preoxygenation: Pre-oxygenation with 100% oxygen Induction Type: IV induction LMA: LMA inserted LMA Size: 4.0 Number of attempts: 1 Placement Confirmation: positive ETCO2 Tube secured with: Tape Dental Injury: Teeth and Oropharynx as per pre-operative assessment

## 2020-05-22 NOTE — Transfer of Care (Signed)
Immediate Anesthesia Transfer of Care Note  Patient: Sarah Pham  Procedure(s) Performed: Excision of abdominal wound with closure (N/A Abdomen)  Patient Location: PACU  Anesthesia Type:General  Level of Consciousness: awake, alert  and oriented  Airway & Oxygen Therapy: Face mask  Post-op Assessment: Report given to RN and Post -op Vital signs reviewed and stable  Post vital signs: Reviewed and stable  Last Vitals:  Vitals Value Taken Time  BP    Temp    Pulse 75 05/22/20 1230  Resp 14 05/22/20 1230  SpO2 100 % 05/22/20 1230  Vitals shown include unvalidated device data.  Last Pain:  Vitals:   05/22/20 0913  TempSrc: Oral  PainSc: 6       Patients Stated Pain Goal: 4 (83/43/73 [id number])  Complications: No complications documented.

## 2020-05-22 NOTE — H&P (Signed)
Sarah Pham is an 55 y.o. female.   Chief Complaint: Abdominal wound HPI: The patient is a 55 year old female here for treatment of her abdominal wound.  Several weeks ago she underwent a panniculectomy.  She had some wound breakdown.  It seems to be creating some pain for her.  She is otherwise doing well.  There is no sign of infection.  She has had surgery including of rotator cuff repair and a hysterectomy.  She has multiple allergies which are listed below.  The chart includes pictures of the wound.  Past Medical History:  Diagnosis Date  . Hyperlipidemia   . Left arm weakness    due to recent RTR  . Obesity   . PONV (postoperative nausea and vomiting)   . Sleep apnea    CPAP  . Tick bite    this caused the patient not to be able to eat meat any longer  . Wears contact lenses     Past Surgical History:  Procedure Laterality Date  . COLONOSCOPY WITH PROPOFOL N/A 03/04/2018   Procedure: COLONOSCOPY WITH BIOPSIES;  Surgeon: [name], MD;  Location: [facility];  Service: Endoscopy;  Laterality: N/A;  sleep apnea  . FOOT SURGERY    . LAPAROSCOPIC GASTRIC SLEEVE RESECTION    . NASAL SINUS SURGERY    . OOPHORECTOMY Left    right was not removed  . POLYPECTOMY N/A 03/04/2018   Procedure: POLYPECTOMY;  Surgeon: [name], MD;  Location: [facility];  Service: Endoscopy;  Laterality: N/A;  . ROTATOR CUFF REPAIR Left 12/31/2017   [facility] Dr Harlow Mares.  Marland Kitchen VAGINAL HYSTERECTOMY  1995    Family History  Problem Relation Age of Onset  . Breast cancer Maternal Aunt   . Breast cancer Maternal Grandmother    Social History:  reports that she quit smoking about 6 months ago. Her smoking use included cigarettes. She has a 30.00 pack-year smoking history. She has never used smokeless tobacco. She reports that she does not drink alcohol and does not use drugs.  Allergies:  Allergies  Allergen Reactions  . Alpha-Gal Anaphylaxis  . Galactose Anaphylaxis  .  Grape Seed Anaphylaxis  . Other Other (See Comments)    Tomatoes, dust and mold. 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 Sulfa Antibiotics Nausea And Vomiting  . Sulfasalazine Nausea And Vomiting  . Tomato Itching    Itchy throat    Medications Prior to Admission  Medication Sig Dispense Refill  . atorvastatin (LIPITOR) 10 MG tablet TAKE 1 TABLET(10 MG) BY MOUTH DAILY 90 tablet 3  . BIOTIN PO Take 2 Doses/Fill by mouth daily.     Marland Kitchen doxycycline (VIBRA-TABS) 100 MG tablet Take 1 tablet (100 mg total) by mouth 2 (two) times daily for 7 days. 14 tablet 0  . furosemide (LASIX) 20 MG tablet TAKE 1 TABLET(20 MG) BY MOUTH DAILY AS NEEDED FOR SWELLING 90 tablet 0  . Multiple Vitamins-Minerals (BARIATRIC MULTIVITAMINS/IRON) CAPS Take 1 tablet by mouth daily.     . traMADol (ULTRAM) 50 MG tablet Take 1 tablet (50 mg total) by mouth every 8 (eight) hours as needed for up to 5 days. 15 tablet 0  . EPINEPHrine 0.3 mg/0.3 mL IJ SOAJ injection Inject 0.3 mg into the muscle as needed for anaphylaxis. 1 each 3    Results for orders placed or performed during the hospital encounter of 05/22/20 (from the past 48 hour(s))  Basic metabolic panel per protocol  Status: None   Collection Time: 05/21/20 10:30 AM  Result Value Ref Range   Sodium 139 135 - 145 mmol/L   Potassium 4.9 3.5 - 5.1 mmol/L   Chloride 103 98 - 111 mmol/L   CO2 30 22 - 32 mmol/L   Glucose, Bld 72 70 - 99 mg/dL    Comment: Glucose reference range applies only to samples taken after fasting for at least 8 hours.   BUN 16 6 - 20 mg/dL   Creatinine, Ser 0.68 0.44 - 1.00 mg/dL   Calcium 10.0 8.9 - 10.3 mg/dL   GFR, Estimated >60 >60 mL/min    Comment: (NOTE) Calculated using the CKD-EPI Creatinine Equation (2021)    Anion gap 6 5 - 15    Comment: Performed at [facility] [address] [place], [place] [address]   No results found.  Review of Systems  Constitutional: Positive for activity change.  Negative for appetite change.  Eyes: Negative.   Respiratory: Negative.  Negative for chest tightness.   Cardiovascular: Negative.  Negative for leg swelling.  Gastrointestinal: Positive for abdominal pain.  Genitourinary: Negative.   Musculoskeletal: Negative.     Blood pressure 96/64, pulse 70, temperature (!) 97.4 F (36.3 C), temperature source Oral, resp. rate 20, height 5\' 5"  (1.651 m), weight 88.7 kg, SpO2 97 %. Physical Exam Vitals and nursing note reviewed.  Constitutional:      Appearance: Normal appearance.  HENT:     Head: Normocephalic and atraumatic.  Cardiovascular:     Rate and Rhythm: Normal rate.     Pulses: Normal pulses.  Pulmonary:     Effort: Pulmonary effort is normal. No respiratory distress.  Abdominal:     General: Abdomen is flat.     Palpations: There is no mass.     Tenderness: There is abdominal tenderness. There is guarding. There is no rebound.     Hernia: No hernia is present.  Musculoskeletal:        General: Normal range of motion.  Skin:    General: Skin is warm.     Coloration: Skin is not jaundiced or pale.     Findings: Erythema present.  Neurological:     Mental Status: She is alert. Mental status is at baseline.  Psychiatric:        Mood and Affect: Mood normal.        Behavior: Behavior normal.        Thought Content: Thought content normal.      Assessment/Plan Plan for excision of the wound and possible closure with placement of celebrate.  The risks and complications were explained to the patient and include bleeding pain scar and the risk of anesthesia.  Patient wishes to proceed.  [place], DO 05/22/2020, 10:07 AM

## 2020-05-22 NOTE — Discharge Instructions (Signed)
*No Tylenol before 3:30pm today, if needed.  Post Anesthesia Home Care Instructions  Activity: Get plenty of rest for the remainder of the day. A responsible individual must stay with you for 24 hours following the procedure.  For the next 24 hours, DO NOT: -Drive a car -Paediatric nurse -Drink alcoholic beverages -Take any medication unless instructed by your physician -Make any legal decisions or sign important papers.  Meals: Start with liquid foods such as gelatin or soup. Progress to regular foods as tolerated. Avoid greasy, spicy, heavy foods. If nausea and/or vomiting occur, drink only clear liquids until the nausea and/or vomiting subsides. Call your physician if vomiting continues.  Special Instructions/Symptoms: Your throat may feel dry or sore from the anesthesia or the breathing tube placed in your throat during surgery. If this causes discomfort, gargle with warm salt water. The discomfort should disappear within 24 hours.  If you had a scopolamine patch placed behind your ear for the management of post- operative nausea and/or vomiting:  1. The medication in the patch is effective for 72 hours, after which it should be removed.  Wrap patch in a tissue and discard in the trash. Wash hands thoroughly with soap and water. 2. You may remove the patch earlier than 72 hours if you experience unpleasant side effects which may include dry mouth, dizziness or visual disturbances. 3. Avoid touching the patch. Wash your hands with soap and water after contact with the patch.    About my Jackson-Pratt Bulb Drain  What is a Jackson-Pratt bulb? A Jackson-Pratt is a soft, round device used to collect drainage. It is connected to a long, thin drainage catheter, which is held in place by one or two small stiches near your surgical incision site. When the bulb is squeezed, it forms a vacuum, forcing the drainage to empty into the bulb.  Emptying the Jackson-Pratt bulb- To empty the  bulb: 1. Release the plug on the top of the bulb. 2. Pour the bulb's contents into a measuring container which your nurse will provide. 3. Record the time emptied and amount of drainage. Empty the drain(s) as often as your     doctor or nurse recommends.  Date                  Time                    Amount (Drain 1)                 Amount (Drain 2)  _____________________________________________________________________  _____________________________________________________________________  _____________________________________________________________________  _____________________________________________________________________  _____________________________________________________________________  _____________________________________________________________________  _____________________________________________________________________  _____________________________________________________________________  Squeezing the Jackson-Pratt Bulb- To squeeze the bulb: 1. Make sure the plug at the top of the bulb is open. 2. Squeeze the bulb tightly in your fist. You will hear air squeezing from the bulb. 3. Replace the plug while the bulb is squeezed. 4. Use a safety pin to attach the bulb to your clothing. This will keep the catheter from     pulling at the bulb insertion site.  When to call your doctor- Call your doctor if:  Drain site becomes red, swollen or hot.  You have a fever greater than 101 degrees F.  There is oozing at the drain site.  Drain falls out (apply a guaze bandage over the drain hole and secure it with tape).  Drainage increases daily not related to activity patterns. (You will usually have more drainage when you are active than  when you are resting.)  Drainage has a bad odor.      JP Drain [employer] this sheet to all of your post-operative appointments while you have your drains.  Please measure your drains by CC's or ML's.  Make sure you  drain and measure your JP Drains 2 or 3 times per day.  At the end of each day, add up totals for the left side and add up totals for the right side.    ( 9 am )     ( 3 pm )        ( 9 pm )                Date L  R  L  R  L  R  Total L/R

## 2020-05-22 NOTE — Anesthesia Preprocedure Evaluation (Addendum)
Anesthesia Evaluation  Patient identified by MRN, date of birth, ID band Patient awake    Reviewed: Allergy & Precautions, NPO status , Patient's Chart, lab work & pertinent test results  History of Anesthesia Complications (+) PONV and history of anesthetic complications  Airway Mallampati: III  TM Distance: >3 FB Neck ROM: Full    Dental no notable dental hx.    Pulmonary Patient abstained from smoking., former smoker,    Pulmonary exam normal breath sounds clear to auscultation       Cardiovascular negative cardio ROS Normal cardiovascular exam Rhythm:Regular Rate:Normal  ECG: NSR, rate 66   Neuro/Psych negative neurological ROS  negative psych ROS   GI/Hepatic negative GI ROS, Neg liver ROS,   Endo/Other  negative endocrine ROS  Renal/GU negative Renal ROS     Musculoskeletal negative musculoskeletal ROS (+)   Abdominal (+) + obese,   Peds  Hematology HLD   Anesthesia Other Findings Panniculitis Post panniculectomy  Reproductive/Obstetrics                            Anesthesia Physical Anesthesia Plan  ASA: III  Anesthesia Plan: General   Post-op Pain Management:    Induction: Intravenous  PONV Risk Score and Plan: 4 or greater and Ondansetron, Dexamethasone, Midazolam, Treatment may vary due to age or medical condition and Amisulpride  Airway Management Planned: LMA  Additional Equipment:   Intra-op Plan:   Post-operative Plan: Extubation in OR  Informed Consent: I have reviewed the patients History and Physical, chart, labs and discussed the procedure including the risks, benefits and alternatives for the proposed anesthesia with the patient or authorized representative who has indicated his/her understanding and acceptance.     Dental advisory given  Plan Discussed with: CRNA  Anesthesia Plan Comments:      Anesthesia Quick Evaluation

## 2020-05-22 NOTE — Anesthesia Postprocedure Evaluation (Signed)
Anesthesia Post Note  Patient: Sarah Pham  Procedure(s) Performed: Excision of abdominal wound with closure (N/A Abdomen)     Patient location during evaluation: PACU Anesthesia Type: General Level of consciousness: awake Pain management: pain level controlled Vital Signs Assessment: post-procedure vital signs reviewed and stable Respiratory status: spontaneous breathing, nonlabored ventilation, respiratory function stable and patient connected to nasal cannula oxygen Cardiovascular status: blood pressure returned to baseline and stable Postop Assessment: no apparent nausea or vomiting Anesthetic complications: no   No complications documented.  Last Vitals:  Vitals:   05/22/20 1300 05/22/20 1326  BP: 113/67 106/71  Pulse: 78 64  Resp: 16 17  Temp:  36.6 C  SpO2: 97% 97%    Last Pain:  Vitals:   05/22/20 1326  TempSrc:   PainSc: 2                  Paulena Servais P Hester Forget

## 2020-05-22 NOTE — Op Note (Signed)
DATE OF OPERATION: 05/22/2020  LOCATION: [facility]  PREOPERATIVE DIAGNOSIS: abdominal wound  POSTOPERATIVE DIAGNOSIS: Same  PROCEDURE: Excision of 20 cm abdominal wound skin, soft tissue with layered closure, repair of left lateral abdominal hernia  SURGEON: Jennine Peddy [place]anger Shaune Malacara, DO  ASSISTANT: Roetta Sessions, PA  EBL: <50 cc  CONDITION: Stable  COMPLICATIONS: None  INDICATION: The patient, Sarah Pham, is a 55 y.o. female born on 1965-03-18, is here for treatment of an abdominal wound after a panniculectomy.   PROCEDURE DETAILS:  The patient was seen prior to surgery and marked.  The IV antibiotics were given. The patient was taken to the operating room and given a general anesthetic. A standard time out was performed and all information was confirmed by those in the room. SCDs were placed.   The abdomen was prepped and draped.  She had an open wound which was also prepped with the betadine.  The #10 blade was used to excise the 3 x 20 cm wound which included skin, soft tissue and fat.  There was a significant amount of fat necrosis but no infection or purulence was noted.  She had a seroma on the right lateral aspect, left lateral aspect and under the mons area for a total of 300 cc of seroma.  There was no sign of purulence.  The #10 blade and the bovie was used to excise the fat necrosis.  A hernia that was 2 cm in size on the right lateral lower abdominal area was identified.  This was repaired with the 0 Ethibond suture with figure of 8 stitches. The area was irrigated with saline.  Hemostasis was achieved with electrocautery.  The drain was placed and brought out on the right side.  It was secured with a 4-0 Silk.  The deep layers were closed with the 3-0 PDS.  The following layers were closed with the 3-0 and 4-0 Monocryl vertical mattress and horizontal mattress sutures.   A prevena was applied with an excellent seal.  The patient was allowed to wake up and  taken to recovery room in stable condition at the end of the case. The family was notified at the end of the case.   The advanced practice practitioner (APP) assisted throughout the case.  The APP was essential in retraction and counter traction when needed to make the case progress smoothly.  This retraction and assistance made it possible to see the tissue plans for the procedure.  The assistance was needed for blood control, tissue re-approximation and assisted with closure of the incision site.

## 2020-05-23 ENCOUNTER — Encounter (HOSPITAL_BASED_OUTPATIENT_CLINIC_OR_DEPARTMENT_OTHER): Payer: Self-pay | Admitting: Plastic Surgery

## 2020-05-28 ENCOUNTER — Ambulatory Visit (INDEPENDENT_AMBULATORY_CARE_PROVIDER_SITE_OTHER): Payer: Managed Care, Other (non HMO) | Admitting: Plastic Surgery

## 2020-05-28 ENCOUNTER — Other Ambulatory Visit: Payer: Self-pay

## 2020-05-28 ENCOUNTER — Encounter: Payer: Self-pay | Admitting: Plastic Surgery

## 2020-05-28 VITALS — BP 105/70 | HR 90

## 2020-05-28 DIAGNOSIS — M793 Panniculitis, unspecified: Secondary | ICD-10-CM

## 2020-05-28 NOTE — Progress Notes (Signed)
The patient is a 55 year old female here with her daughter for follow-up on her abdominal wound excision.  The drain is in with minimal drainage.  We took the [place] off.  She is got a little bit of redness and irritation but overall she is doing well incision is closed.  I like her to have the drain for 1 more week and then we will plan on taking it out.

## 2020-06-02 ENCOUNTER — Encounter: Payer: Self-pay | Admitting: Family Medicine

## 2020-06-02 ENCOUNTER — Other Ambulatory Visit: Payer: Self-pay | Admitting: Physician Assistant

## 2020-06-02 DIAGNOSIS — R609 Edema, unspecified: Secondary | ICD-10-CM

## 2020-06-02 NOTE — Telephone Encounter (Signed)
Requested Prescriptions  Pending Prescriptions Disp Refills  . furosemide (LASIX) 20 MG tablet [Pharmacy Med Name: FUROSEMIDE 20MG  TABLETS] 90 tablet 0    Sig: TAKE 1 TABLET(20 MG) BY MOUTH DAILY AS NEEDED FOR SWELLING     Cardiovascular:  Diuretics - Loop Passed - 06/02/2020  6:20 AM      Passed - K in normal range and within 360 days    Potassium  Date Value Ref Range Status  05/21/2020 4.9 3.5 - 5.1 mmol/L Final         Passed - Ca in normal range and within 360 days    Calcium  Date Value Ref Range Status  05/21/2020 10.0 8.9 - 10.3 mg/dL Final         Passed - Na in normal range and within 360 days    Sodium  Date Value Ref Range Status  05/21/2020 139 135 - 145 mmol/L Final  07/31/2019 140 134 - 144 mmol/L Final         Passed - Cr in normal range and within 360 days    Creatinine, Ser  Date Value Ref Range Status  05/21/2020 0.68 0.44 - 1.00 mg/dL Final         Passed - Last BP in normal range    BP Readings from Last 1 Encounters:  05/28/20 105/70         Passed - Valid encounter within last 6 months    Recent Outpatient Visits          2 months ago Viral upper respiratory tract infection   [facility], Kelby Aline, FNP   10 months ago Annual physical exam   [facility] [place], Clearnce Sorrel, [place]   1 year ago Annual physical exam   [facility], Clearnce Sorrel, [place]   2 years ago [place], [place], [place]   2 years ago Pre-operative clearance   [facility] [place], Clearnce Sorrel, [place]      Future Appointments            In 3 months Bacigalupo, Dionne Bucy, MD [facility], [place]

## 2020-06-03 ENCOUNTER — Encounter: Payer: Self-pay | Admitting: Plastic Surgery

## 2020-06-03 ENCOUNTER — Other Ambulatory Visit: Payer: Self-pay

## 2020-06-03 ENCOUNTER — Ambulatory Visit (INDEPENDENT_AMBULATORY_CARE_PROVIDER_SITE_OTHER): Payer: Managed Care, Other (non HMO) | Admitting: Plastic Surgery

## 2020-06-03 VITALS — BP 130/82 | HR 85

## 2020-06-03 DIAGNOSIS — M793 Panniculitis, unspecified: Secondary | ICD-10-CM

## 2020-06-03 MED ORDER — CIPROFLOXACIN HCL 500 MG PO TABS: 500.0000 mg | ORAL_TABLET | Freq: Two times a day (BID) | ORAL | 0 refills | Status: AC

## 2020-06-03 NOTE — Progress Notes (Signed)
The patient is a 55 year old female here for follow-up on her panniculectomy surgery.  Her drain output has been minimal.  I went ahead and remove the drain since it is nearly not putting anything out.  There is certainly the risk that she will have a little bit more drainage from the incision sites with removing the drain.  However I did want to increase her risk of infection from keeping the drain into the long.  Her pubic area is a little bit red and swollen.  I Georgina Peer go ahead and send in some Cipro for her.  I would like to see her back in a week.  She is to call if she has any questions or concerns.  If she is doing really well she can push her appointment out for 2 weeks.  She was accompanied by her daughter.  She is to use Vaseline over the drain site and Xeroform over the incision site.

## 2020-06-11 ENCOUNTER — Ambulatory Visit: Payer: Managed Care, Other (non HMO) | Admitting: Surgical

## 2020-06-18 ENCOUNTER — Other Ambulatory Visit: Payer: Self-pay

## 2020-06-18 ENCOUNTER — Ambulatory Visit (INDEPENDENT_AMBULATORY_CARE_PROVIDER_SITE_OTHER): Payer: Managed Care, Other (non HMO) | Admitting: Surgical

## 2020-06-18 ENCOUNTER — Encounter: Payer: Self-pay | Admitting: Surgical

## 2020-06-18 VITALS — BP 130/83 | HR 101

## 2020-06-18 DIAGNOSIS — M793 Panniculitis, unspecified: Secondary | ICD-10-CM

## 2020-06-18 DIAGNOSIS — Z9884 Bariatric surgery status: Secondary | ICD-10-CM

## 2020-06-18 NOTE — Progress Notes (Signed)
Patient is a 55 year old female here for follow-up on her panniculectomy surgery.  She most recently underwent excision of her abdominal wound with closure on 05/22/2020.  She is 4 weeks postop from this.  She reports that overall she is doing well.  She has noticed some drainage from her incision.  She reports that it has no odor.  She reports that she is not having any infectious symptoms.  Chaperone present on exam On exam umbilicus incision is healing nicely.  Abdominal incision is overall healing well.  The area that had reexcision is overall intact, however does have a small opening along the right side that is approximately 6 mm.  I was able to express some yellowish drainage from the area, possibly some liquefied fat necrosis.  There is no surrounding cellulitic changes.  No tenderness to palpation.  Recommend continue to avoid strenuous activities.  Recommend avoiding any heavy lifting. Recommend following up in 2 to 3 weeks for reevaluation. Recommend calling with questions or concerns. There is no sign of infection, seroma, hematoma at this time

## 2020-07-09 ENCOUNTER — Ambulatory Visit (INDEPENDENT_AMBULATORY_CARE_PROVIDER_SITE_OTHER): Payer: Managed Care, Other (non HMO) | Admitting: Surgical

## 2020-07-09 ENCOUNTER — Other Ambulatory Visit: Payer: Self-pay

## 2020-07-09 DIAGNOSIS — M793 Panniculitis, unspecified: Secondary | ICD-10-CM

## 2020-07-09 DIAGNOSIS — Z9884 Bariatric surgery status: Secondary | ICD-10-CM

## 2020-07-09 NOTE — Progress Notes (Signed)
Patient is a 55 year old female here for follow-up on her panniculectomy/abdominoplasty surgery.  She subsequently underwent additional excision of abdominal wound with closure on 05/22/2020.  She is approximately 7 weeks postop from her second surgery and 10 weeks postop from her panniculectomy/abdominoplasty.  She reports that she is continue to wear spanks.  She reports that she has tried sleeping in bed flat but reports that this is uncomfortable for her.  She has been sleeping in the recliner which is more comfortable.  She reports overall she is doing really well.  She still has a few small wounds along the midline abdominal fold incision.  There is no surrounding cellulitic changes or foul odor is noted.  She does have a little bit of serosanguineous drainage from the right medial abdominal wound that is approximately 5 x 5 mm.  There is no purulence noted.  Umbilicus is well-healed Chaperone present on exam  Recommend Vaseline and gauze to the wounds along the panniculectomy incision. Recommend continue to wear compressive garment whether this is spanks or her current compression garment.  Recommend continue to avoid any strenuous activities given the small wounds that she has at this time.  Recommend following up in 3 to 4 weeks for reevaluation.  Recommend calling with questions or concerns.  There is no sign of infection, hematoma

## 2020-07-31 ENCOUNTER — Other Ambulatory Visit: Payer: Self-pay

## 2020-07-31 ENCOUNTER — Ambulatory Visit (INDEPENDENT_AMBULATORY_CARE_PROVIDER_SITE_OTHER): Payer: Managed Care, Other (non HMO) | Admitting: Surgical

## 2020-07-31 DIAGNOSIS — Z9884 Bariatric surgery status: Secondary | ICD-10-CM

## 2020-07-31 DIAGNOSIS — M793 Panniculitis, unspecified: Secondary | ICD-10-CM

## 2020-07-31 DIAGNOSIS — Z719 Counseling, unspecified: Secondary | ICD-10-CM

## 2020-07-31 NOTE — Progress Notes (Signed)
Patient is a 55 year old female here for follow-up on her panniculectomy/abdominoplasty surgery.  She subsequently underwent additional excision of abdominal wound with closure on 05/22/2020.  She is 10 weeks postop from the reexcision and 13 weeks postop from her initial surgery.  She reports overall she is doing well.  She reports that she still has some wounds present on the lower abdomen.  She is not having any infectious symptoms.  [place] beach [place].  She reports she is interested in pursuing surgical intervention for bilateral breast reduction for which she has previously had a consultation with Dr. Marla Roe about.  Chaperone present on exam On exam umbilicus incision is intact and well-healed.  Transverse abdominal incision is overall healed very nicely.  She does have a few small wounds noted the largest is 3 x 3 mm without any surrounding erythema.  It does not appear to be tracking.  There is no foul odor.  There is no drainage noted.  Abdomen is nontender.  No further restrictions.  Recommend continue with Vaseline and gauze to abdominal wounds.  These do not appear infected.  Picture was taken and placed in the patient's chart with patient's permission.  There is no sign of infection, seroma, hematoma.    Will reach out to surgical scheduling team to submit to insurance for bilateral breast reduction.  She would like to schedule this for October if possible.  Recommend following up in 1 to 2 months for reevaluation of her abdomen and to discuss bilateral breast reduction.

## 2020-08-02 ENCOUNTER — Encounter: Payer: Managed Care, Other (non HMO) | Admitting: Physician Assistant

## 2020-08-15 ENCOUNTER — Ambulatory Visit: Payer: Managed Care, Other (non HMO) | Admitting: Surgical

## 2020-08-31 ENCOUNTER — Other Ambulatory Visit: Payer: Self-pay | Admitting: Physician Assistant

## 2020-08-31 ENCOUNTER — Other Ambulatory Visit: Payer: Self-pay | Admitting: Family Medicine

## 2020-08-31 DIAGNOSIS — R609 Edema, unspecified: Secondary | ICD-10-CM

## 2020-08-31 DIAGNOSIS — E7800 Pure hypercholesterolemia, unspecified: Secondary | ICD-10-CM

## 2020-09-12 ENCOUNTER — Ambulatory Visit (INDEPENDENT_AMBULATORY_CARE_PROVIDER_SITE_OTHER): Payer: Managed Care, Other (non HMO) | Admitting: Family Medicine

## 2020-09-12 ENCOUNTER — Other Ambulatory Visit: Payer: Self-pay

## 2020-09-12 ENCOUNTER — Encounter: Payer: Self-pay | Admitting: Family Medicine

## 2020-09-12 VITALS — BP 118/78 | HR 79 | Temp 98.4°F | Resp 16 | Ht 65.0 in | Wt 186.7 lb

## 2020-09-12 DIAGNOSIS — E7849 Other hyperlipidemia: Secondary | ICD-10-CM | POA: Diagnosis not present

## 2020-09-12 DIAGNOSIS — E756 Lipid storage disorder, unspecified: Secondary | ICD-10-CM

## 2020-09-12 DIAGNOSIS — E8809 Other disorders of plasma-protein metabolism, not elsewhere classified: Secondary | ICD-10-CM

## 2020-09-12 DIAGNOSIS — Z0000 Encounter for general adult medical examination without abnormal findings: Secondary | ICD-10-CM

## 2020-09-12 DIAGNOSIS — Z6831 Body mass index (BMI) 31.0-31.9, adult: Secondary | ICD-10-CM

## 2020-09-12 DIAGNOSIS — D582 Other hemoglobinopathies: Secondary | ICD-10-CM

## 2020-09-12 DIAGNOSIS — Z1159 Encounter for screening for other viral diseases: Secondary | ICD-10-CM | POA: Diagnosis not present

## 2020-09-12 DIAGNOSIS — E669 Obesity, unspecified: Secondary | ICD-10-CM

## 2020-09-12 DIAGNOSIS — Z1231 Encounter for screening mammogram for malignant neoplasm of breast: Secondary | ICD-10-CM

## 2020-09-12 NOTE — Progress Notes (Signed)
Annual Wellness Visit     Patient: Sarah Pham, Female    DOB: September 08, 1965, 55 y.o.   MRN: [id number] Visit Date: 09/12/2020  Today's Provider: Lavon Paganini, MD   Chief Complaint  Patient presents with   Annual Exam   Subjective    Sarah Pham is a 55 y.o. female who presents today for her Annual Wellness Visit. She reports consuming a  vegetarian  diet. Home exercise routine includes calisthenics. She generally feels well. She reports sleeping well. She does not have additional problems to discuss today.   HPI  Hyperlipidemia - Stable on statin, denies myalgias; due for repeat lipid panel  Health Maintenance - Due for mammo - Pt. declines pneumococcal, shingles vaccines & COVID booster - Pt. amenable to Hep C screening today - UTD on colonoscopy - Hysterectomy in 1995; no longer needs pap smears  Alpha Gal - Stable, pt. requests repeat alpha gal panel today  Medications: Outpatient Medications Prior to Visit  Medication Sig   atorvastatin (LIPITOR) 10 MG tablet TAKE 1 TABLET(10 MG) BY MOUTH DAILY   BIOTIN PO Take 2 Doses/Fill by mouth daily.    EPINEPHrine 0.3 mg/0.3 mL IJ SOAJ injection Inject 0.3 mg into the muscle as needed for anaphylaxis.   furosemide (LASIX) 20 MG tablet TAKE 1 TABLET(20 MG) BY MOUTH DAILY AS NEEDED FOR SWELLING   Multiple Vitamins-Minerals (BARIATRIC MULTIVITAMINS/IRON) CAPS Take 1 tablet by mouth daily.    No facility-administered medications prior to visit.    Allergies  Allergen Reactions   Alpha-Gal Anaphylaxis   Galactose Anaphylaxis   Grape Seed Anaphylaxis   Other Other (See Comments)    Tomatoes, dust and mold. Patient has Alpha Gal- Allergic to all hooved animal   Sulfa Antibiotics Nausea And Vomiting   Sulfasalazine Nausea And Vomiting   Tomato Itching    Itchy throat    Patient Care Team: Virginia Crews, MD as PCP - General (Family Medicine)  Review of Systems  Constitutional:  Negative for fatigue and  fever.  HENT: Negative.    Eyes: Negative.  Negative for visual disturbance.  Respiratory: Negative.  Negative for shortness of breath.   Cardiovascular: Negative.  Negative for chest pain and leg swelling.  Gastrointestinal: Negative.   Endocrine: Negative.   Genitourinary: Negative.   Musculoskeletal:  Positive for back pain. Negative for myalgias.  Skin: Negative.   Allergic/Immunologic: Positive for food allergies.  Neurological: Negative.       Objective    Vitals: BP 118/78   Pulse 79   Temp 98.4 F (36.9 C) (Oral)   Resp 16   Ht 5\' 5"  (1.651 m)   Wt 186 lb 11.2 oz (84.7 kg)   BMI 31.07 kg/m   Physical Exam Constitutional:      General: She is not in acute distress.    Appearance: Normal appearance. She is obese.  HENT:     Head: Normocephalic and atraumatic.     Right Ear: External ear normal.     Left Ear: External ear normal.  Eyes:     Conjunctiva/sclera: Conjunctivae normal.  Cardiovascular:     Rate and Rhythm: Normal rate and regular rhythm.     Pulses: Normal pulses.     Heart sounds: Normal heart sounds.  Pulmonary:     Effort: Pulmonary effort is normal.     Breath sounds: Normal breath sounds.  Chest:  Breasts:    Breasts are symmetrical.     Right: Normal.  Left: Normal.  Skin:    General: Skin is warm and dry.  Neurological:     Mental Status: She is alert.  Psychiatric:        Behavior: Behavior normal.        Thought Content: Thought content normal.   Most recent functional status assessment: In your present state of health, do you have any difficulty performing the following activities: 09/12/2020  Hearing? N  Vision? N  Difficulty concentrating or making decisions? N  Walking or climbing stairs? N  Dressing or bathing? N  Doing errands, shopping? N  Some recent data might be hidden   Most recent fall risk assessment: Fall Risk  09/12/2020  Falls in the past year? 0  Number falls in past yr: 0  Injury with Fall? 0    Most  recent depression screenings: PHQ 2/9 Scores 09/12/2020 07/31/2019  PHQ - 2 Score 0 0  PHQ- 9 Score 0 -   Most recent cognitive screening: No flowsheet data found. Most recent Audit-C alcohol use screening Alcohol Use Disorder Test (AUDIT) 07/31/2019  1. How often do you have a drink containing alcohol? 0  2. How many drinks containing alcohol do you have on a typical day when you are drinking? 0  3. How often do you have six or more drinks on one occasion? 0  AUDIT-C Score 0  Alcohol Brief Interventions/Follow-up AUDIT Score <7 follow-up not indicated   A score of 3 or more in women, and 4 or more in men indicates increased risk for alcohol abuse, EXCEPT if all of the points are from question 1   No results found for any visits on 09/12/20.  Assessment & Plan     Annual wellness visit done today including the all of the following: Reviewed patient's Family Medical History Reviewed and updated list of patient's medical providers Assessment of cognitive impairment was done Assessed patient's functional ability Established a written schedule for health screening services Health Risk Assessent Completed and Reviewed  Exercise Activities and Dietary recommendations  Goals   None     Immunization History  Administered Date(s) Administered   Influenza,inj,Quad PF,6+ Mos 12/22/2017, 12/11/2018   PFIZER(Purple Top)SARS-COV-2 Vaccination 06/05/2019, 06/15/2019   Td 02/23/2005   Tdap 07/22/2018    Health Maintenance  Topic Date Due   Pneumococcal Vaccine 15-24 Years old (1 - PCV) Never done   Hepatitis C Screening  Never done   Zoster Vaccines- Shingrix (1 of 2) Never done   PAP SMEAR-Modifier  06/06/2019   COVID-19 Vaccine (3 - Mixed Product risk series) 07/13/2019   INFLUENZA VACCINE  09/23/2020   MAMMOGRAM  08/29/2021   COLONOSCOPY (Pts 45-51yrs Insurance coverage will need to be confirmed)  03/05/2023   TETANUS/TDAP  07/21/2028   HIV Screening  Completed   HPV VACCINES   Aged Out     Discussed health benefits of physical activity, and encouraged her to engage in regular exercise appropriate for her age and condition.    Problem List Items Addressed This Visit       Other   Familial multiple lipoprotein-type hyperlipidemia   Relevant Orders   Comprehensive metabolic panel   Lipid panel   Alpha galactosidase deficiency   Relevant Orders   Alpha-Gal Panel   Other Visit Diagnoses     Encounter for annual physical exam    -  Primary   Relevant Orders   MM 3D SCREEN BREAST BILATERAL   Hepatitis C Antibody   Cytology - PAP  Comprehensive metabolic panel   Lipid panel   Hemoglobin A1c   Alpha-Gal Panel   CBC w/Diff/Platelet   Need for hepatitis C screening test       Relevant Orders   Hepatitis C Antibody   Class 1 obesity without serious comorbidity with body mass index (BMI) of 31.0 to 31.9 in adult, unspecified obesity type       Relevant Orders   Comprehensive metabolic panel   Lipid panel   Hemoglobin A1c   Elevated hemoglobin (HCC)       Relevant Orders   CBC w/Diff/Platelet   Screening mammogram for breast cancer       Relevant Orders   MM 3D SCREEN BREAST BILATERAL        Return in about 1 year (around 09/12/2021) for CPE.    Percell Locus, MS3  Patient seen along with MS3 student Percell Locus. I personally evaluated this patient along with the student, and verified all aspects of the history, physical exam, and medical decision making as documented by the student. I agree with the student's documentation and have made all necessary edits.  Cera Rorke, Dionne Bucy, MD, MPH [facility]

## 2020-09-16 LAB — COMPREHENSIVE METABOLIC PANEL
ALT: 15 IU/L (ref 0–32)
AST: 17 IU/L (ref 0–40)
Albumin/Globulin Ratio: 1.5 (ref 1.2–2.2)
Albumin: 4.6 g/dL (ref 3.8–4.9)
Alkaline Phosphatase: 83 IU/L (ref 44–121)
BUN/Creatinine Ratio: 24 — ABNORMAL HIGH (ref 9–23)
BUN: 18 mg/dL (ref 6–24)
Bilirubin Total: 0.4 mg/dL (ref 0.0–1.2)
CO2: 23 mmol/L (ref 20–29)
Calcium: 10.5 mg/dL — ABNORMAL HIGH (ref 8.7–10.2)
Chloride: 100 mmol/L (ref 96–106)
Creatinine, Ser: 0.75 mg/dL (ref 0.57–1.00)
Globulin, Total: 3.1 g/dL (ref 1.5–4.5)
Glucose: 129 mg/dL — ABNORMAL HIGH (ref 65–99)
Potassium: 4.1 mmol/L (ref 3.5–5.2)
Sodium: 141 mmol/L (ref 134–144)
Total Protein: 7.7 g/dL (ref 6.0–8.5)
eGFR: 96 mL/min/{1.73_m2} (ref >59)

## 2020-09-16 LAB — LIPID PANEL
Chol/HDL Ratio: 4.7 ratio — ABNORMAL HIGH (ref 0.0–4.4)
Cholesterol, Total: 199 mg/dL (ref 100–199)
HDL: 42 mg/dL (ref >39)
LDL Chol Calc (NIH): 132 mg/dL — ABNORMAL HIGH (ref 0–99)
Triglycerides: 138 mg/dL (ref 0–149)
VLDL Cholesterol Cal: 25 mg/dL (ref 5–40)

## 2020-09-16 LAB — CBC WITH DIFFERENTIAL/PLATELET
Basophils Absolute: 0.1 10*3/uL (ref 0.0–0.2)
Basos: 1 %
EOS (ABSOLUTE): 0.2 10*3/uL (ref 0.0–0.4)
Eos: 2 %
Hematocrit: 50.9 % — ABNORMAL HIGH (ref 34.0–46.6)
Hemoglobin: 17.4 g/dL — ABNORMAL HIGH (ref 11.1–15.9)
Immature Grans (Abs): 0 10*3/uL (ref 0.0–0.1)
Immature Granulocytes: 0 %
Lymphocytes Absolute: 4.8 10*3/uL — ABNORMAL HIGH (ref 0.7–3.1)
Lymphs: 39 %
MCH: 27.6 pg (ref 26.6–33.0)
MCHC: 34.2 g/dL (ref 31.5–35.7)
MCV: 81 fL (ref 79–97)
Monocytes Absolute: 0.5 10*3/uL (ref 0.1–0.9)
Monocytes: 4 %
Neutrophils Absolute: 6.8 10*3/uL (ref 1.4–7.0)
Neutrophils: 54 %
Platelets: 273 10*3/uL (ref 150–450)
RBC: 6.31 x10E6/uL — ABNORMAL HIGH (ref 3.77–5.28)
RDW: 17.2 % — ABNORMAL HIGH (ref 11.7–15.4)
WBC: 12.4 10*3/uL — ABNORMAL HIGH (ref 3.4–10.8)

## 2020-09-16 LAB — ALPHA-GAL PANEL
Allergen Lamb IgE: 0.24 kU/L — AB
Beef IgE: 0.78 kU/L — AB
IgE (Immunoglobulin E), Serum: 35 IU/mL (ref 6–495)
O215-IgE Alpha-Gal: 2.04 kU/L — AB
Pork IgE: 0.28 kU/L — AB

## 2020-09-16 LAB — HEMOGLOBIN A1C
Est. average glucose Bld gHb Est-mCnc: 120 mg/dL
Hgb A1c MFr Bld: 5.8 % — ABNORMAL HIGH (ref 4.8–5.6)

## 2020-09-16 LAB — HEPATITIS C ANTIBODY: Hep C Virus Ab: <0.1 s/co ratio (ref 0.0–0.9)

## 2020-09-17 ENCOUNTER — Encounter: Payer: Self-pay | Admitting: Plastic Surgery

## 2020-09-17 ENCOUNTER — Other Ambulatory Visit: Payer: Self-pay

## 2020-09-17 ENCOUNTER — Ambulatory Visit: Payer: Managed Care, Other (non HMO) | Admitting: Plastic Surgery

## 2020-09-17 DIAGNOSIS — M793 Panniculitis, unspecified: Secondary | ICD-10-CM

## 2020-09-17 NOTE — Progress Notes (Signed)
The patient is a 55 year old female here for follow-up on her panniculectomy.  She is completely healed.  She does have some fat necrosis along the incision line.  But no sign of infection or seroma.  She is very pleased with her progress.  Her bellybutton looks really good.  She is planning on having a breast reduction.  She may want to do some work on her mons.  I told her just to remind me about that when it gets closer to that time

## 2020-09-24 ENCOUNTER — Other Ambulatory Visit: Payer: Self-pay

## 2020-09-24 ENCOUNTER — Ambulatory Visit
Admission: RE | Admit: 2020-09-24 | Discharge: 2020-09-24 | Disposition: A | Discharge status: Home / Self Care | Payer: Managed Care, Other (non HMO) | Source: Ambulatory Visit | Attending: Family Medicine | Admitting: Family Medicine

## 2020-09-24 DIAGNOSIS — Z1231 Encounter for screening mammogram for malignant neoplasm of breast: Secondary | ICD-10-CM | POA: Diagnosis present

## 2020-09-24 DIAGNOSIS — Z0000 Encounter for general adult medical examination without abnormal findings: Secondary | ICD-10-CM | POA: Insufficient documentation

## 2020-11-29 ENCOUNTER — Other Ambulatory Visit: Payer: Self-pay | Admitting: Family Medicine

## 2020-11-29 DIAGNOSIS — R609 Edema, unspecified: Secondary | ICD-10-CM

## 2020-11-29 DIAGNOSIS — E7800 Pure hypercholesterolemia, unspecified: Secondary | ICD-10-CM

## 2020-11-29 NOTE — Telephone Encounter (Signed)
Requested Prescriptions  Pending Prescriptions Disp Refills  . furosemide (LASIX) 20 MG tablet [Pharmacy Med Name: FUROSEMIDE 20MG  TABLETS] 90 tablet 0    Sig: TAKE 1 TABLET(20 MG) BY MOUTH DAILY AS NEEDED FOR SWELLING     Cardiovascular:  Diuretics - Loop Failed - 11/29/2020  6:21 AM      Failed - Ca in normal range and within 360 days    Calcium  Date Value Ref Range Status  09/12/2020 10.5 (H) 8.7 - 10.2 mg/dL Final         Passed - K in normal range and within 360 days    Potassium  Date Value Ref Range Status  09/12/2020 4.1 3.5 - 5.2 mmol/L Final         Passed - Na in normal range and within 360 days    Sodium  Date Value Ref Range Status  09/12/2020 141 134 - 144 mmol/L Final         Passed - Cr in normal range and within 360 days    Creatinine, Ser  Date Value Ref Range Status  09/12/2020 0.75 0.57 - 1.00 mg/dL Final         Passed - Last BP in normal range    BP Readings from Last 1 Encounters:  09/12/20 118/78         Passed - Valid encounter within last 6 months    Recent Outpatient Visits          2 months ago Encounter for annual physical exam   [facility] [place], Dionne Bucy, MD   8 months ago Viral upper respiratory tract infection   [place] [facility], Kelby Aline, FNP   1 year ago Annual physical exam   [facility] [place], Clearnce Sorrel, [place]   2 years ago Annual physical exam   [facility] [place], Clearnce Sorrel, [place]   2 years ago [place], Clearnce Sorrel, [place]      Future Appointments            In 9 months Gwyneth Sprout, [place], [place]           . atorvastatin (LIPITOR) 10 MG tablet [Pharmacy Med Name: ATORVASTATIN 10MG  TABLETS] 90 tablet 0    Sig: TAKE 1 TABLET(10 MG) BY MOUTH DAILY     Cardiovascular:  Antilipid - Statins Failed - 11/29/2020  6:21 AM      Failed - LDL in normal range and within 360 days    LDL Chol  Calc (NIH)  Date Value Ref Range Status  09/12/2020 132 (H) 0 - 99 mg/dL Final         Passed - Total Cholesterol in normal range and within 360 days    Cholesterol, Total  Date Value Ref Range Status  09/12/2020 199 100 - 199 mg/dL Final         Passed - HDL in normal range and within 360 days    HDL  Date Value Ref Range Status  09/12/2020 42 >39 mg/dL Final         Passed - Triglycerides in normal range and within 360 days    Triglycerides  Date Value Ref Range Status  09/12/2020 138 0 - 149 mg/dL Final         Passed - Patient is not pregnant      Passed - Valid encounter within last 12 months    Recent Outpatient Visits  2 months ago Encounter for annual physical exam   [facility] [place], Dionne Bucy, MD   8 months ago Viral upper respiratory tract infection   [facility] Fl[facility], Kelby Aline, FNP   1 year ago Annual physical exam   [facility], Clearnce Sorrel, [place]   2 years ago Annual physical exam   [facility] [place], Clearnce Sorrel, [place]   2 years ago [place], Clearnce Sorrel, [place]      Future Appointments            In 9 months Gwyneth Sprout, [place], [place]

## 2020-12-04 ENCOUNTER — Encounter: Payer: Managed Care, Other (non HMO) | Admitting: Surgical

## 2020-12-24 ENCOUNTER — Encounter: Payer: Managed Care, Other (non HMO) | Admitting: Plastic Surgery

## 2021-01-01 ENCOUNTER — Encounter: Payer: Managed Care, Other (non HMO) | Admitting: Surgical

## 2021-02-17 ENCOUNTER — Other Ambulatory Visit: Payer: Self-pay | Admitting: Family Medicine

## 2021-02-17 DIAGNOSIS — E7800 Pure hypercholesterolemia, unspecified: Secondary | ICD-10-CM

## 2021-02-17 DIAGNOSIS — R609 Edema, unspecified: Secondary | ICD-10-CM

## 2021-02-18 ENCOUNTER — Other Ambulatory Visit: Payer: Self-pay

## 2021-02-18 DIAGNOSIS — R609 Edema, unspecified: Secondary | ICD-10-CM

## 2021-02-18 MED ORDER — FUROSEMIDE 20 MG PO TABS: ORAL_TABLET | ORAL | 0 refills | Status: DC

## 2021-02-18 MED ORDER — ATORVASTATIN CALCIUM 10 MG PO TABS: 10.0000 mg | ORAL_TABLET | Freq: Every day | ORAL | 0 refills | Status: DC

## 2021-04-30 IMAGING — MG DIGITAL SCREENING BILAT W/ TOMO W/ CAD
8 series · 8 of 24 positions shown · non-contrast
Comparison: Previous exam(s).

CLINICAL DATA: Screening.

EXAM:
DIGITAL SCREENING BILATERAL MAMMOGRAM WITH TOMO AND CAD

[L CC synth-2D]
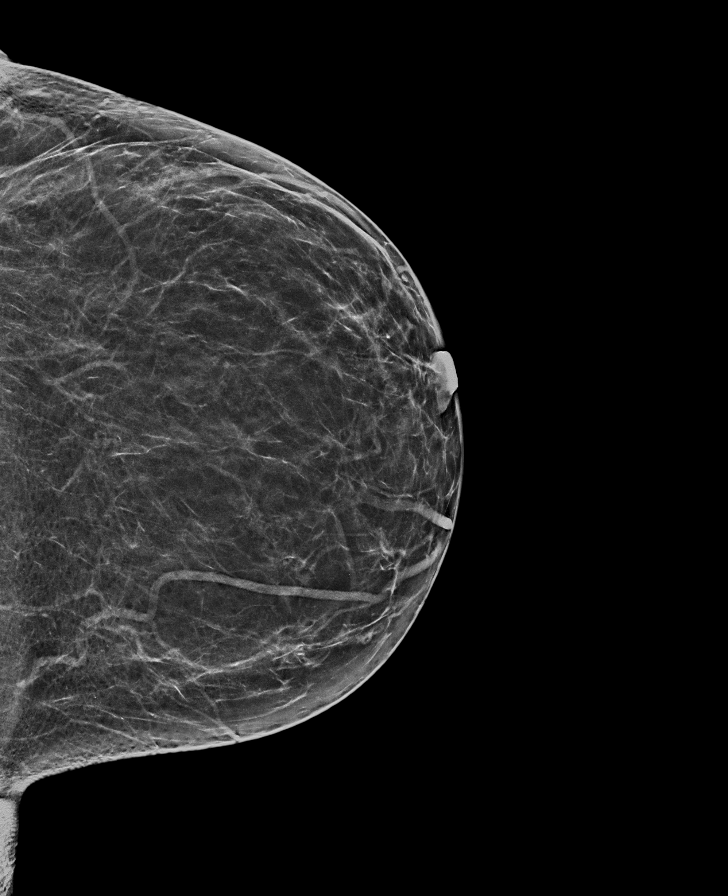

[R MLO synth-2D]
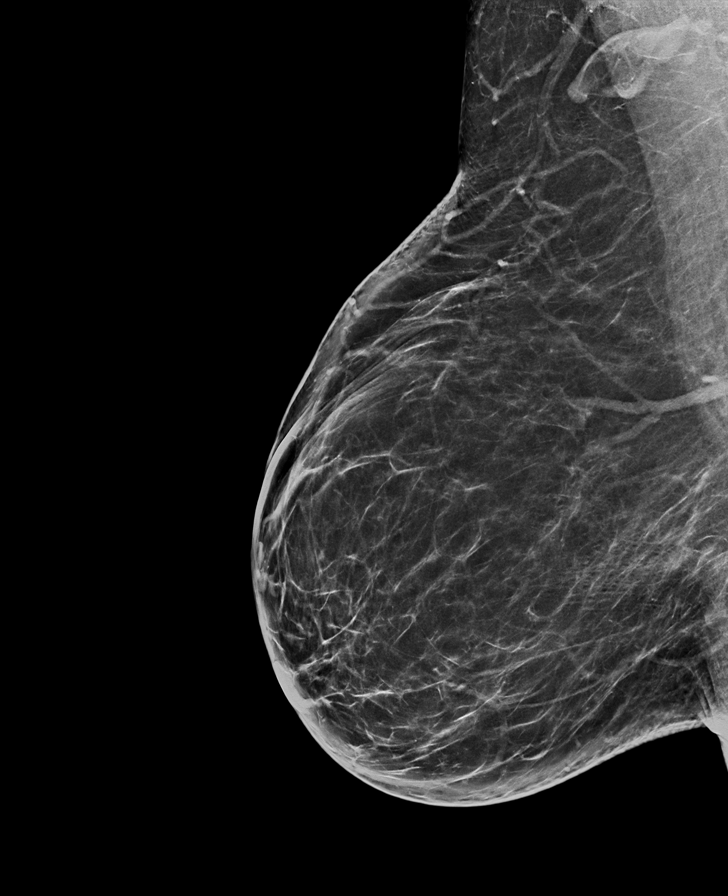

[R CC synth-2D]
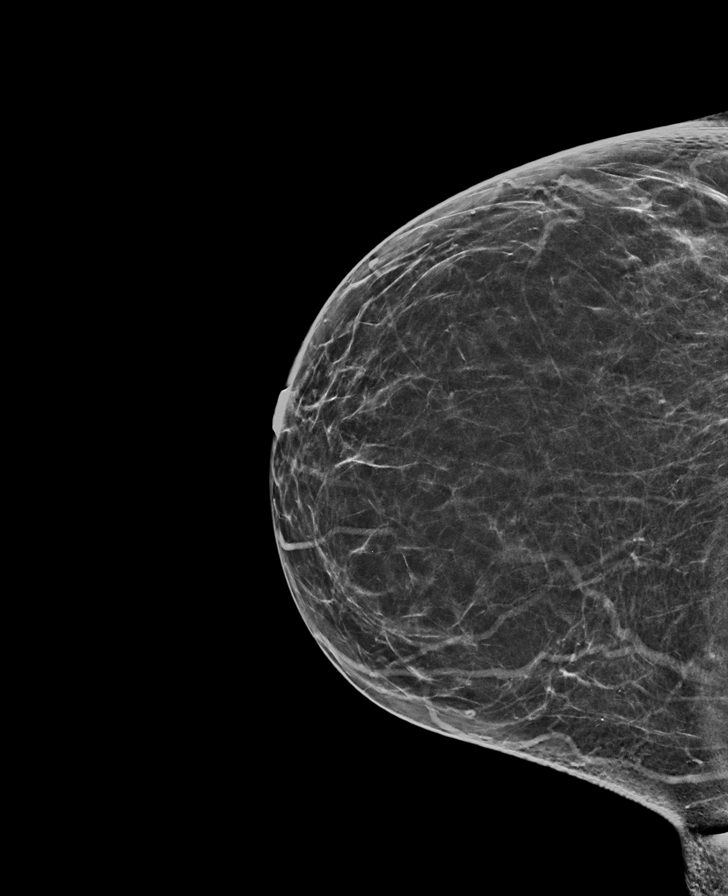

[L MLO synth-2D]
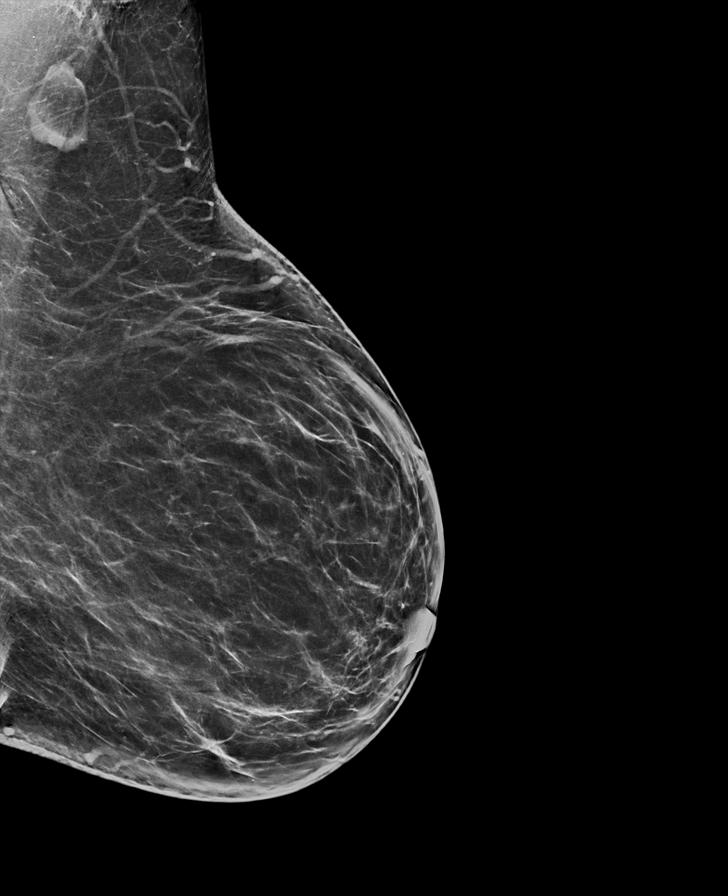

[R MLO tomo · tomo slice 41/81.0]
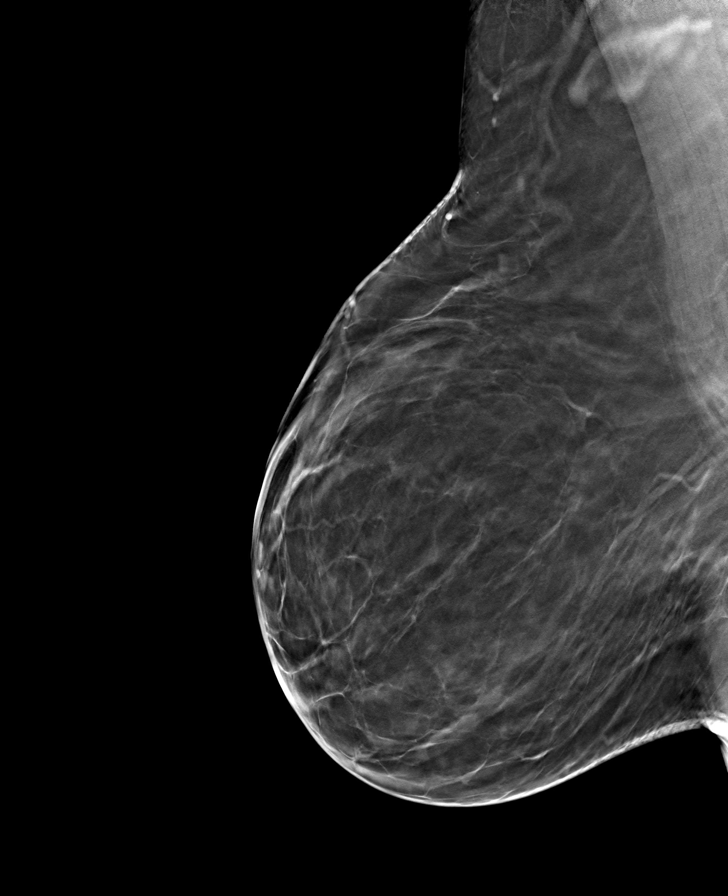

[L MLO tomo · tomo slice 39/77.0]
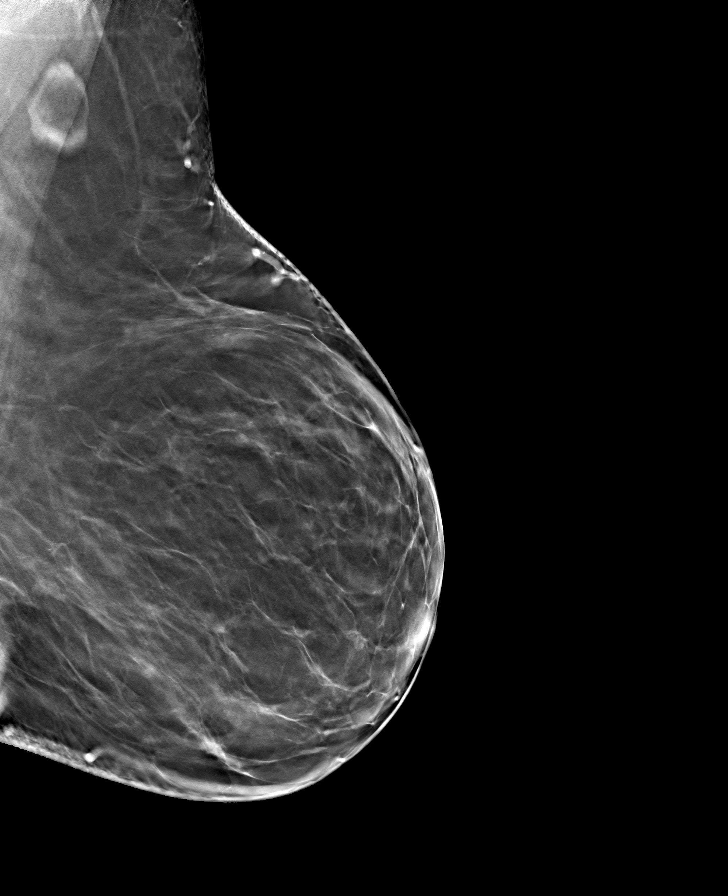

[L CC tomo · tomo slice 33/64.0]
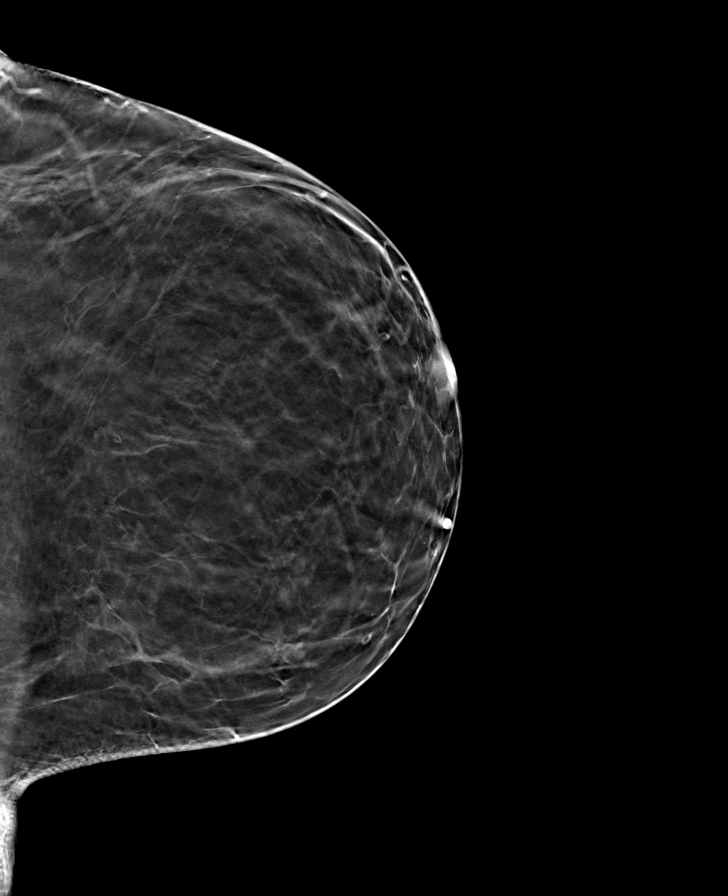

[R CC tomo · tomo slice 33/66.0]
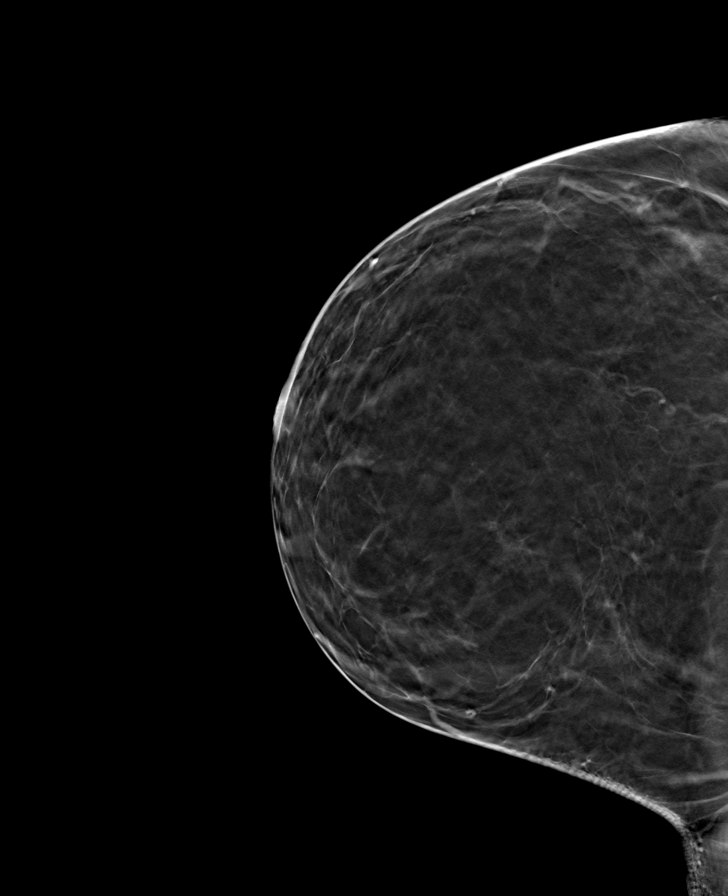

[8 of 24 positions shown; findings below may reference images not displayed]

ACR Breast Density Category b: There are scattered areas of
fibroglandular density.
FINDINGS: There are no findings suspicious for malignancy. Images were
processed with CAD.
IMPRESSION: No mammographic evidence of malignancy. A result letter of this
screening mammogram will be mailed directly to the patient.

RECOMMENDATION:
Screening mammogram in one year. (Code:[id number])

BI-RADS CATEGORY  1: Negative.

## 2021-05-02 ENCOUNTER — Ambulatory Visit: Payer: 59 | Admitting: Physician Assistant

## 2021-05-02 ENCOUNTER — Encounter: Payer: Self-pay | Admitting: Physician Assistant

## 2021-05-02 ENCOUNTER — Other Ambulatory Visit: Payer: Self-pay

## 2021-05-02 VITALS — BP 134/85 | HR 83 | Temp 98.6°F | Resp 16 | Ht 65.0 in | Wt 189.1 lb

## 2021-05-02 DIAGNOSIS — T3695XA Adverse effect of unspecified systemic antibiotic, initial encounter: Secondary | ICD-10-CM

## 2021-05-02 DIAGNOSIS — J0100 Acute maxillary sinusitis, unspecified: Secondary | ICD-10-CM

## 2021-05-02 DIAGNOSIS — B379 Candidiasis, unspecified: Secondary | ICD-10-CM

## 2021-05-02 DIAGNOSIS — L905 Scar conditions and fibrosis of skin: Secondary | ICD-10-CM | POA: Diagnosis not present

## 2021-05-02 MED ORDER — FLUCONAZOLE 150 MG PO TABS: 150.0000 mg | ORAL_TABLET | Freq: Once | ORAL | 0 refills | Status: AC

## 2021-05-02 MED ORDER — AMOXICILLIN 875 MG PO TABS: 875.0000 mg | ORAL_TABLET | Freq: Two times a day (BID) | ORAL | 0 refills | Status: AC

## 2021-05-02 NOTE — Patient Instructions (Signed)
I have called in an antibiotic Amoxicillin 875 mg to be taken by mouth twice per day for 7 days ?Please finish the entire antibiotic course to ensure it is completely resolved ? ?You can use nasal saline sprays and your Nettipot -use distilled water or boiled, cooled water only in the Nettipot  ? ?You can use a humidifier at night to help with nasal irritation ? ?You can use Dayquil/Nyquil or another multi-symptom relief medication per your preference for cough and further congestin relief ? ?

## 2021-05-02 NOTE — Progress Notes (Signed)
?  ? ?I,Sarah Pham,acting as a scribe for [employer], PA-C.,have documented all relevant documentation on the behalf of [place], PA-C,as directed by  [employer], PA-C while in the presence of Sarah Theall E Nirvana Blanchett, PA-C.  ? ?Established patient visit ? ? ?Patient: Sarah Pham   DOB: [birth year]   56 y.o. Female  MRN: [id number] ?Visit Date: 05/02/2021 ? ?Today's healthcare provider: Dani Gobble Teruo Stilley, PA-C  ?Introduced myself to the patient as a Journalist, newspaper and provided education on APPs in clinical practice.  ? ? ?Chief Complaint  ?Patient presents with  ? URI  ? ?Subjective  ?  ?URI  ?Associated symptoms include congestion, coughing, headaches and sinus pain. Pertinent negatives include no diarrhea, nausea, rash, sore throat or vomiting.  ?HPI   ? ? URI   ?Associated symptoms inlclude congestion, cough, headache, rhinorrhea and sinus pain (Postnasal drip).  Recent episode started 1 to 4 weeks ago.  The problem has been unchanged since onset.  The temperature has been with in normal range.  Patient  is drinking plenty of fluids.  Past hisotry is significant for  no history of pneumonia or bronchitis.  Patient is a smoker. ? ?  ?  ?Last edited by Doristine Devoid, CMA on 05/02/2021 10:47 AM.  ?  ?  ?Took Covid test on 02/28 it was negative. She has been taking Mucinex since then. ?States she started feeling bad on 2-28 ?She has taken several at home COVID tests that have all been negative ?She reports increased coughing at night when she lays down  ?States this started with sinus congestion and pressure and has progressed to include coughing ?Has not improved with Mucinex  ?States symptoms have not improved at all and seems to have gotten worse ? ?She also has concerns around surgical scar from a procedure performed almost a year ago ?States her abdomen in RLQ is hurting and has not stopped hurting since she had surgery ?States it is a "nagging" pain all the time but not to degree that pain meds are  needed ? ? ? ? ?Medications: ?Outpatient Medications Prior to Visit  ?Medication Sig  ? atorvastatin (LIPITOR) 10 MG tablet Take 1 tablet (10 mg total) by mouth daily.  ? BIOTIN PO Take 2 Doses/Fill by mouth daily.   ? EPINEPHrine 0.3 mg/0.3 mL IJ SOAJ injection Inject 0.3 mg into the muscle as needed for anaphylaxis.  ? furosemide (LASIX) 20 MG tablet 1 tablet AS NEEDED FOR SWELLING.  ? Multiple Vitamins-Minerals (BARIATRIC MULTIVITAMINS/IRON) CAPS Take 1 tablet by mouth daily.   ? ?No facility-administered medications prior to visit.  ? ? ?Review of Systems  ?Constitutional:  Negative for fever.  ?HENT:  Positive for congestion, ear discharge, sinus pressure and sinus pain. Negative for sore throat.   ?Respiratory:  Positive for cough. Negative for shortness of breath.   ?Gastrointestinal:  Negative for diarrhea, nausea and vomiting.  ?Musculoskeletal:  Negative for arthralgias and myalgias.  ?Skin:  Negative for rash.  ?Neurological:  Positive for headaches. Negative for dizziness, weakness and light-headedness.  ? ? ?  Objective  ?  ?BP 134/85 (BP Location: Right Arm, Patient Position: Sitting, Cuff Size: Normal)   Pulse 83   Temp 98.6 ?F (37 ?C) (Oral)   Resp 16   Ht '5\' 5"'$  (1.651 m)   Wt 189 lb 1.6 oz (85.8 kg)   BMI 31.47 kg/m?  ? ? ?Physical Exam ?Vitals reviewed.  ?Constitutional:   ?  General: She is awake.  ?   Appearance: Normal appearance. She is well-developed, well-groomed and overweight.  ?HENT:  ?   Head: Normocephalic and atraumatic.  ?   Right Ear: Hearing and ear canal normal. A middle ear effusion is present. Tympanic membrane is erythematous.  ?   Left Ear: Hearing and ear canal normal. A middle ear effusion is present. Tympanic membrane is erythematous.  ?   Nose: Congestion and rhinorrhea present. Rhinorrhea is bloody.  ?   Right Turbinates: Enlarged and swollen.  ?   Left Turbinates: Enlarged.  ?   Right Sinus: Maxillary sinus tenderness and frontal sinus tenderness present.  ?   Left  Sinus: Maxillary sinus tenderness and frontal sinus tenderness present.  ?   Mouth/Throat:  ?   Pharynx: Uvula midline. Posterior oropharyngeal erythema present. No oropharyngeal exudate.  ?Neck:  ?   Thyroid: No thyroid mass.  ?Cardiovascular:  ?   Rate and Rhythm: Normal rate and regular rhythm.  ?   Pulses: Normal pulses.  ?   Heart sounds: Normal heart sounds.  ?Pulmonary:  ?   Effort: Pulmonary effort is normal.  ?   Breath sounds: Normal breath sounds. No decreased breath sounds, wheezing, rhonchi or rales.  ?Abdominal:  ?   General: A surgical scar is present. Bowel sounds are normal.  ?   Tenderness: There is abdominal tenderness in the right lower quadrant. Negative signs include Murphy's sign and McBurney's sign.  ?   Hernia: No hernia is present.  ?   Comments: Surgical scar extending across suprapubic area to lateral abdomen  ?Tenderness over RLQ   ?Musculoskeletal:  ?   Cervical back: Normal range of motion and neck supple.  ?Lymphadenopathy:  ?   Head:  ?   Right side of head: No submental or submandibular adenopathy.  ?   Left side of head: No submental or submandibular adenopathy.  ?   Upper Body:  ?   Right upper body: No supraclavicular adenopathy.  ?   Left upper body: No supraclavicular adenopathy.  ?Neurological:  ?   Mental Status: She is alert.  ?Psychiatric:     ?   Behavior: Behavior is cooperative.  ?  ? ? ?No results found for any visits on 05/02/21. ? Assessment & Plan  ?  ? ?Problem List Items Addressed This Visit   ?None ?Visit Diagnoses   ? ? Acute maxillary sinusitis, recurrence not specified    -  Primary ?Acute, new problem ?Reports congestion that has not improved over last 12 days and has progressed to include cough ?With isolated sinus symptoms and duration I suspect this is bacterial sinusitis ?Will treat with Amoxicillin 875 mg PO BID x 7 days ?Recommend symptomatic management with OTC dayquil/nyquil or other multisymptom relief medication along with nasal saline sprays,  Netipot and humidifier.  ?  ? Relevant Medications  ? amoxicillin (AMOXIL) 875 MG tablet  ? fluconazole (DIFLUCAN) 150 MG tablet  ? Antibiotic-induced yeast infection      ? Relevant Medications  ? fluconazole (DIFLUCAN) 150 MG tablet  ? ?  ? ?3. Scar of abdominal skin ?Patient reports "nagging" daily pain since her surgery almost a year ago ?Discussed that this could be due to scar tissue but recommend she return to surgeon for evaluation since her surgery was extensive and adhesions cannot be ruled out at this time.  ? ? ?No follow-ups on file. ? ? ?I, Shreshta Medley E Cleo Santucci, PA-C, have reviewed all documentation for this  visit. The documentation on 05/02/21 for the exam, diagnosis, procedures, and orders are all accurate and complete. ? ? ?Add Dinapoli, [place] MPH ?[place] ?[facility] ? ? ? ? ?No follow-ups on file.  ?   ? ? ? ?Trust Crago E Tarsha Blando, PA-C  ?[place] ?[phone] (phone) ?[phone] (fax) ? ?[facility]  ?

## 2021-08-12 ENCOUNTER — Other Ambulatory Visit: Payer: Self-pay | Admitting: Family Medicine

## 2021-08-12 DIAGNOSIS — E7800 Pure hypercholesterolemia, unspecified: Secondary | ICD-10-CM

## 2021-08-12 DIAGNOSIS — R609 Edema, unspecified: Secondary | ICD-10-CM

## 2021-08-13 NOTE — Telephone Encounter (Signed)
Requested Prescriptions  Pending Prescriptions Disp Refills  . furosemide (LASIX) 20 MG tablet [Pharmacy Med Name: FUROSEMIDE '20MG'$  TABLETS] 90 tablet 0    Sig: TAKE 1 TABLET BY MOUTH AS NEEDED FOR SWELLING     Cardiovascular:  Diuretics - Loop Failed - 08/12/2021  8:36 PM      Failed - K in normal range and within 180 days    Potassium  Date Value Ref Range Status  09/12/2020 4.1 3.5 - 5.2 mmol/L Final         Failed - Ca in normal range and within 180 days    Calcium  Date Value Ref Range Status  09/12/2020 10.5 (H) 8.7 - 10.2 mg/dL Final         Failed - Na in normal range and within 180 days    Sodium  Date Value Ref Range Status  09/12/2020 141 134 - 144 mmol/L Final         Failed - Cr in normal range and within 180 days    Creatinine, Ser  Date Value Ref Range Status  09/12/2020 0.75 0.57 - 1.00 mg/dL Final         Failed - Cl in normal range and within 180 days    Chloride  Date Value Ref Range Status  09/12/2020 100 96 - 106 mmol/L Final         Failed - Mg Level in normal range and within 180 days    No results found for: "MG"       Passed - Last BP in normal range    BP Readings from Last 1 Encounters:  05/02/21 134/85         Passed - Valid encounter within last 6 months    Recent Outpatient Visits          3 months ago Acute maxillary sinusitis, recurrence not specified   [employer], Dani Gobble, PA-C   11 months ago Encounter for annual physical exam   [employer] [facility], Dionne Bucy, [place]   1 year ago Viral upper respiratory tract infection   [place] Fl[facility], Kelby Aline, FNP   2 years ago Annual physical exam   [employer], Clearnce Sorrel, [place]   3 years ago Annual physical exam   [employer], Clearnce Sorrel, [place]      Future Appointments            In 1 month Gwyneth Sprout, [place], [place]           . atorvastatin (LIPITOR)  10 MG tablet [Pharmacy Med Name: ATORVASTATIN '10MG'$  TABLETS] 90 tablet 0    Sig: TAKE 1 TABLET(10 MG) BY MOUTH DAILY     Cardiovascular:  Antilipid - Statins Failed - 08/12/2021  8:36 PM      Failed - Lipid Panel in normal range within the last 12 months    Cholesterol, Total  Date Value Ref Range Status  09/12/2020 199 100 - 199 mg/dL Final   LDL Chol Calc (NIH)  Date Value Ref Range Status  09/12/2020 132 (H) 0 - 99 mg/dL Final   HDL  Date Value Ref Range Status  09/12/2020 42 >39 mg/dL Final   Triglycerides  Date Value Ref Range Status  09/12/2020 138 0 - 149 mg/dL Final         Passed - Patient is not pregnant      Passed - Valid encounter within last 12 months  Recent Outpatient Visits          3 months ago Acute maxillary sinusitis, recurrence not specified   [place], PA-C   11 months ago Encounter for annual physical exam   [employer] [facility], Dionne Bucy, [place]   1 year ago Viral upper respiratory tract infection   [place] Fl[facility], Kelby Aline, FNP   2 years ago Annual physical exam   [place], Clearnce Sorrel, [place]   3 years ago Annual physical exam   [place], Clearnce Sorrel, [place]      Future Appointments            In 1 month Rollene Rotunda, [place], [place], [place]

## 2021-08-13 NOTE — Telephone Encounter (Signed)
Requested medication (s) are due for refill today: yes  Requested medication (s) are on the active medication list: yes  Last refill:  02/18/21 #90/0  Future visit scheduled: yes  Notes to clinic:  Unable to refill per protocol due to failed labs, no updated results.    Requested Prescriptions  Pending Prescriptions Disp Refills   furosemide (LASIX) 20 MG tablet [Pharmacy Med Name: FUROSEMIDE '20MG'$  TABLETS] 90 tablet 0    Sig: TAKE 1 TABLET BY MOUTH AS NEEDED FOR SWELLING     Cardiovascular:  Diuretics - Loop Failed - 08/12/2021  8:36 PM      Failed - K in normal range and within 180 days    Potassium  Date Value Ref Range Status  09/12/2020 4.1 3.5 - 5.2 mmol/L Final         Failed - Ca in normal range and within 180 days    Calcium  Date Value Ref Range Status  09/12/2020 10.5 (H) 8.7 - 10.2 mg/dL Final         Failed - Na in normal range and within 180 days    Sodium  Date Value Ref Range Status  09/12/2020 141 134 - 144 mmol/L Final         Failed - Cr in normal range and within 180 days    Creatinine, Ser  Date Value Ref Range Status  09/12/2020 0.75 0.57 - 1.00 mg/dL Final         Failed - Cl in normal range and within 180 days    Chloride  Date Value Ref Range Status  09/12/2020 100 96 - 106 mmol/L Final         Failed - Mg Level in normal range and within 180 days    No results found for: "MG"       Passed - Last BP in normal range    BP Readings from Last 1 Encounters:  05/02/21 134/85         Passed - Valid encounter within last 6 months    Recent Outpatient Visits           3 months ago Acute maxillary sinusitis, recurrence not specified   [employer], Dani Gobble, PA-C   11 months ago Encounter for annual physical exam   [facility] [facility], Dionne Bucy, MD   1 year ago Viral upper respiratory tract infection   [place], Kelby Aline, FNP   2 years ago Annual physical exam   [employer], Clearnce Sorrel, [place]   3 years ago Annual physical exam   [employer], Clearnce Sorrel, [place]       Future Appointments             In 1 month Gwyneth Sprout, FNP [employer], [facility]            Signed Prescriptions Disp Refills   atorvastatin (LIPITOR) 10 MG tablet 90 tablet 0    Sig: TAKE 1 TABLET(10 MG) BY MOUTH DAILY     Cardiovascular:  Antilipid - Statins Failed - 08/12/2021  8:36 PM      Failed - Lipid Panel in normal range within the last 12 months    Cholesterol, Total  Date Value Ref Range Status  09/12/2020 199 100 - 199 mg/dL Final   LDL Chol Calc (NIH)  Date Value Ref Range Status  09/12/2020 132 (H) 0 - 99 mg/dL Final   HDL  Date  Value Ref Range Status  09/12/2020 42 >39 mg/dL Final   Triglycerides  Date Value Ref Range Status  09/12/2020 138 0 - 149 mg/dL Final         Passed - Patient is not pregnant      Passed - Valid encounter within last 12 months    Recent Outpatient Visits           3 months ago Acute maxillary sinusitis, recurrence not specified   [employer], Dani Gobble, PA-C   11 months ago Encounter for annual physical exam   [employer] [facility], Dionne Bucy, MD   1 year ago Viral upper respiratory tract infection   [facility], Kelby Aline, FNP   2 years ago Annual physical exam   [facility] [place], Clearnce Sorrel, [place]   3 years ago Annual physical exam   [place], Clearnce Sorrel, [place]       Future Appointments             In 1 month Rollene Rotunda, Jaci Standard, [place], [place]

## 2021-09-11 NOTE — Progress Notes (Signed)
Complete physical exam  I,Joseline E Rosas,acting as a scribe for Gwyneth Sprout, FNP.,have documented all relevant documentation on the behalf of Gwyneth Sprout, FNP,as directed by  Gwyneth Sprout, FNP while in the presence of Gwyneth Sprout, FNP.   Patient: Sarah Pham   DOB: 1965-07-07   56 y.o. Female  MRN: [id number] Visit Date: 09/12/2021  Today's healthcare provider: Gwyneth Sprout, FNP  Patient presents for new patient visit to establish care.  Introduced to Designer, jewellery role and practice setting.  All questions answered.  Discussed provider/patient relationship and expectations.   Chief Complaint  Patient presents with   Annual Exam   Subjective    Sarah Pham is a 56 y.o. female who presents today for a complete physical exam.  She reports consuming a  less carbs  diet. Home exercise routine includes walks 3 times a week. She generally feels well. She reports sleeping well. She does not have additional problems to discuss today.   HPI    Past Medical History:  Diagnosis Date   Hyperlipidemia    Left arm weakness    due to recent RTR   Obesity    PONV (postoperative nausea and vomiting)    Sleep apnea    CPAP   Tick bite    this caused the patient not to be able to eat meat any longer   Wears contact lenses    Past Surgical History:  Procedure Laterality Date   COLONOSCOPY WITH PROPOFOL N/A 03/04/2018   Procedure: COLONOSCOPY WITH BIOPSIES;  Surgeon: [name], MD;  Location: [facility];  Service: Endoscopy;  Laterality: N/A;  sleep apnea   DEBRIDEMENT AND CLOSURE WOUND N/A 05/22/2020   Procedure: Excision of abdominal wound with closure;  Surgeon: Wallace Going, DO;  Location: [facility];  Service: Plastics;  Laterality: N/A;   FOOT SURGERY     LAPAROSCOPIC GASTRIC SLEEVE RESECTION     NASAL SINUS SURGERY     OOPHORECTOMY Left    right was not removed   POLYPECTOMY N/A 03/04/2018   Procedure: POLYPECTOMY;  Surgeon:  [name], MD;  Location: [facility];  Service: Endoscopy;  Laterality: N/A;   ROTATOR CUFF REPAIR Left 12/31/2017   [facility].   VAGINAL HYSTERECTOMY  1995   Social History   Socioeconomic History   Marital status: Married    Spouse name: Not on file   Number of children: Not on file   Years of education: Not on file   Highest education level: Not on file  Occupational History   Not on file  Tobacco Use   Smoking status: Former    Packs/day: 1.00    Years: 30.00    Total pack years: 30.00    Types: Cigarettes    Quit date: 10/25/2019    Years since quitting: 1.8   Smokeless tobacco: Never   Tobacco comments:    15 cig in a week  Vaping Use   Vaping Use: Never used  Substance and Sexual Activity   Alcohol use: No    Alcohol/week: 0.0 standard drinks of alcohol   Drug use: No   Sexual activity: Yes  Other Topics Concern   Not on file  Social History Narrative   Not on file   Social Determinants of Health   Financial Resource Strain: Not on file  Food Insecurity: Not on file  Transportation Needs: Not on file  Physical Activity:  Not on file  Stress: Not on file  Social Connections: Not on file  Intimate Partner Violence: Not on file   Family Status  Relation Name Status   Mother  Alive   Father  Alive   Mat Aunt  (Not Specified)   MGM  (Not Specified)   Family History  Problem Relation Age of Onset   Breast cancer Maternal Aunt    Breast cancer Maternal Grandmother    Allergies  Allergen Reactions   Alpha-Gal Anaphylaxis   Galactose Anaphylaxis   Grape Seed Anaphylaxis   Other Other (See Comments)    Tomatoes, dust and mold. Patient has Alpha Gal- Allergic to all hooved animal   Sulfa Antibiotics Nausea And Vomiting   Sulfasalazine Nausea And Vomiting   Tomato Itching    Itchy throat    Patient Care Team: Gwyneth Sprout, FNP as PCP - General (Family Medicine)   Medications: Outpatient Medications Prior to Visit   Medication Sig   atorvastatin (LIPITOR) 10 MG tablet TAKE 1 TABLET(10 MG) BY MOUTH DAILY   BIOTIN PO Take 2 Doses/Fill by mouth daily.    furosemide (LASIX) 20 MG tablet TAKE 1 TABLET BY MOUTH AS NEEDED FOR SWELLING   Multiple Vitamins-Minerals (BARIATRIC MULTIVITAMINS/IRON) CAPS Take 1 tablet by mouth daily.    Semaglutide-Weight Management (WEGOVY) 0.25 MG/0.5ML SOAJ    [DISCONTINUED] EPINEPHrine 0.3 mg/0.3 mL IJ SOAJ injection Inject 0.3 mg into the muscle as needed for anaphylaxis.   No facility-administered medications prior to visit.    Review of Systems  Constitutional: Negative.   HENT: Negative.    Eyes: Negative.   Respiratory: Negative.    Cardiovascular: Negative.   Gastrointestinal: Negative.   Endocrine: Negative.   Genitourinary: Negative.   Musculoskeletal: Negative.   Skin: Negative.   Allergic/Immunologic: Negative.   Neurological: Negative.   Hematological: Negative.   Psychiatric/Behavioral: Negative.    All other systems reviewed and are negative.   Objective     BP 112/77 (BP Location: Left Arm, Patient Position: Sitting, Cuff Size: Normal)   Pulse 78   Temp 98.1 F (36.7 C) (Oral)   Resp 16   Ht '5\' 5"'$  (1.651 m)   Wt 190 lb 1.6 oz (86.2 kg)   BMI 31.63 kg/m     Physical Exam Vitals and nursing note reviewed.  Constitutional:      General: She is awake. She is not in acute distress.    Appearance: Normal appearance. She is well-developed and well-groomed. She is obese. She is not ill-appearing, toxic-appearing or diaphoretic.  HENT:     Head: Normocephalic and atraumatic.     Jaw: There is normal jaw occlusion. No trismus, tenderness, swelling or pain on movement.     Right Ear: Hearing, tympanic membrane, ear canal and external ear normal. There is no impacted cerumen.     Left Ear: Hearing, tympanic membrane, ear canal and external ear normal. There is no impacted cerumen.     Nose: Nose normal. No congestion or rhinorrhea.     Right  Turbinates: Not enlarged, swollen or pale.     Left Turbinates: Not enlarged, swollen or pale.     Right Sinus: No maxillary sinus tenderness or frontal sinus tenderness.     Left Sinus: No maxillary sinus tenderness or frontal sinus tenderness.     Mouth/Throat:     Lips: Pink.     Mouth: Mucous membranes are moist. No injury.     Tongue: No lesions.  Pharynx: Oropharynx is clear. Uvula midline. No pharyngeal swelling, oropharyngeal exudate, posterior oropharyngeal erythema or uvula swelling.     Tonsils: No tonsillar exudate or tonsillar abscesses.  Eyes:     General: Lids are normal. Lids are everted, no foreign bodies appreciated. Vision grossly intact. Gaze aligned appropriately. No allergic shiner or visual field deficit.       Right eye: No discharge.        Left eye: No discharge.     Extraocular Movements: Extraocular movements intact.     Conjunctiva/sclera: Conjunctivae normal.     Right eye: Right conjunctiva is not injected. No exudate.    Left eye: Left conjunctiva is not injected. No exudate.    Pupils: Pupils are equal, round, and reactive to light.  Neck:     Thyroid: No thyroid mass, thyromegaly or thyroid tenderness.     Vascular: No carotid bruit.     Trachea: Trachea normal.  Cardiovascular:     Rate and Rhythm: Normal rate and regular rhythm.     Pulses: Normal pulses.          Carotid pulses are 2+ on the right side and 2+ on the left side.      Radial pulses are 2+ on the right side and 2+ on the left side.       Dorsalis pedis pulses are 2+ on the right side and 2+ on the left side.       Posterior tibial pulses are 2+ on the right side and 2+ on the left side.     Heart sounds: Normal heart sounds, S1 normal and S2 normal. No murmur heard.    No friction rub. No gallop.  Pulmonary:     Effort: Pulmonary effort is normal. No respiratory distress.     Breath sounds: Normal breath sounds and air entry. No stridor. No wheezing, rhonchi or rales.  Chest:      Chest wall: No tenderness.     Comments: Breasts: breasts appear normal, no suspicious masses, no skin or nipple changes or axillary nodes, symmetric fibrous changes in both upper outer quadrants, right breast normal without mass, skin or nipple changes or axillary nodes, left breast normal without mass, skin or nipple changes or axillary nodes, risk and benefit of breast self-exam was discussed  Abdominal:     General: Abdomen is flat. Bowel sounds are normal. There is no distension.     Palpations: Abdomen is soft. There is no mass.     Tenderness: There is no abdominal tenderness. There is no right CVA tenderness, left CVA tenderness, guarding or rebound.     Hernia: A hernia is present. Hernia is present in the umbilical area.     Comments: Small at 11 oclock   Genitourinary:    Comments: Exam deferred; denies complaints Musculoskeletal:        General: No swelling, tenderness, deformity or signs of injury. Normal range of motion.     Cervical back: Full passive range of motion without pain, normal range of motion and neck supple. No edema, rigidity or tenderness. No muscular tenderness.     Right lower leg: No edema.     Left lower leg: No edema.  Lymphadenopathy:     Cervical: No cervical adenopathy.     Right cervical: No superficial, deep or posterior cervical adenopathy.    Left cervical: No superficial, deep or posterior cervical adenopathy.  Skin:    General: Skin is warm and dry.  Capillary Refill: Capillary refill takes less than 2 seconds.     Coloration: Skin is not jaundiced or pale.     Findings: No bruising, erythema, lesion or rash.  Neurological:     General: No focal deficit present.     Mental Status: She is alert and oriented to person, place, and time. Mental status is at baseline.     GCS: GCS eye subscore is 4. GCS verbal subscore is 5. GCS motor subscore is 6.     Sensory: Sensation is intact. No sensory deficit.     Motor: Motor function is intact. No  weakness.     Coordination: Coordination is intact. Coordination normal.     Gait: Gait is intact. Gait normal.  Psychiatric:        Attention and Perception: Attention and perception normal.        Mood and Affect: Mood and affect normal.        Speech: Speech normal.        Behavior: Behavior normal. Behavior is cooperative.        Thought Content: Thought content normal.        Cognition and Memory: Cognition and memory normal.        Judgment: Judgment normal.      Last depression screening scores    09/12/2021    9:57 AM 05/02/2021   10:48 AM 09/12/2020   11:10 AM  PHQ 2/9 Scores  PHQ - 2 Score 0 0 0  PHQ- 9 Score   0   Last fall risk screening    09/12/2021    9:57 AM  Fall Risk   Falls in the past year? 0  Number falls in past yr: 0  Injury with Fall? 0   Last Audit-C alcohol use screening    09/12/2021    9:57 AM  Alcohol Use Disorder Test (AUDIT)  1. How often do you have a drink containing alcohol? 1  2. How many drinks containing alcohol do you have on a typical day when you are drinking? 0  3. How often do you have six or more drinks on one occasion? 1  AUDIT-C Score 2   A score of 3 or more in women, and 4 or more in men indicates increased risk for alcohol abuse, EXCEPT if all of the points are from question 1   No results found for any visits on 09/12/21.  Assessment & Plan    Routine Health Maintenance and Physical Exam  Exercise Activities and Dietary recommendations  Goals   None     Immunization History  Administered Date(s) Administered   Influenza,inj,Quad PF,6+ Mos 12/22/2017, 12/11/2018   PFIZER(Purple Top)SARS-COV-2 Vaccination 06/05/2019, 06/15/2019   Td 02/23/2005   Tdap 07/22/2018    Health Maintenance  Topic Date Due   Zoster Vaccines- Shingrix (1 of 2) Never done   COVID-19 Vaccine (3 - Pfizer series) 08/10/2019   INFLUENZA VACCINE  09/23/2021   MAMMOGRAM  09/25/2022   COLONOSCOPY (Pts 45-1yr Insurance coverage will need  to be confirmed)  03/05/2023   TETANUS/TDAP  07/21/2028   Hepatitis C Screening  Completed   HIV Screening  Completed   HPV VACCINES  Aged Out    Discussed health benefits of physical activity, and encouraged her to engage in regular exercise appropriate for her age and condition.  Problem List Items Addressed This Visit       Other   Alpha galactosidase deficiency    Chronic, stable Reports additional tick bite  Request for repeat lab test Request for epi pen fill      Relevant Medications   EPINEPHrine 0.3 mg/0.3 mL IJ SOAJ injection   Other Relevant Orders   Alpha-Gal Panel   Encounter for annual physical exam - Primary    UTD on dental UTD on vision Things to do to keep yourself healthy  - Exercise at least 30-45 minutes a day, 3-4 days a week.  - Eat a low-fat diet with lots of fruits and vegetables, up to 7-9 servings per day.  - Seatbelts can save your life. Wear them always.  - Smoke detectors on every level of your home, check batteries every year.  - Eye Doctor - have an eye exam every 1-2 years  - Safe sex - if you may be exposed to STDs, use a condom.  - Alcohol -  If you drink, do it moderately, less than 2 drinks per day.  - [place]iona. Choose someone to speak for you if you are not able.  - Depression is common in our stressful world.If you're feeling down or losing interest in things you normally enjoy, please come in for a visit.  - Violence - If anyone is threatening or hurting you, please call immediately.        Relevant Orders   Comprehensive Metabolic Panel (CMET)   CBC with Differential/Platelet   TSH + free T4   Lipid panel   Mixed hyperlipidemia    Chronic, previous elevated Repeat NFLP On 10 lipitor The 10-year ASCVD risk score (Arnett DK, et al., 2019) is: 1.7%   Values used to calculate the score:     Age: 71 years     Sex: Female     Is Non-Hispanic African American: No     Diabetic: No     Tobacco smoker: No      Systolic Blood Pressure: 448 mmHg     Is BP treated: No     HDL Cholesterol: 42 mg/dL     Total Cholesterol: 199 mg/dL       Relevant Medications   EPINEPHrine 0.3 mg/0.3 mL IJ SOAJ injection   Obesity (BMI 30.0-34.9)    Body mass index is 31.63 kg/m. Discussed importance of healthy weight management Discussed diet and exercise       Prediabetes    Continue to recommend balanced, lower carb meals. Smaller meal size, adding snacks. Choosing water as drink of choice and increasing purposeful exercise.       Relevant Orders   Hemoglobin A1c   Screening mammogram for breast cancer    Due for screening for mammogram, denies breast concerns, provided with phone number to call and schedule appointment for mammogram. Encouraged to repeat breast cancer screening every 1-2 years.       Relevant Orders   MM 3D SCREEN BREAST BILATERAL    Return in about 1 year (around 09/13/2022) for annual examination.     Vonna Kotyk, FNP, have reviewed all documentation for this visit. The documentation on 09/12/21 for the exam, diagnosis, procedures, and orders are all accurate and complete.    Gwyneth Sprout, [place] [phone] (phone) [phone] (fax)  [place]

## 2021-09-12 ENCOUNTER — Ambulatory Visit (INDEPENDENT_AMBULATORY_CARE_PROVIDER_SITE_OTHER): Payer: 59 | Admitting: Family Medicine

## 2021-09-12 ENCOUNTER — Encounter: Payer: Self-pay | Admitting: Family Medicine

## 2021-09-12 VITALS — BP 112/77 | HR 78 | Temp 98.1°F | Resp 16 | Ht 65.0 in | Wt 190.1 lb

## 2021-09-12 DIAGNOSIS — E8809 Other disorders of plasma-protein metabolism, not elsewhere classified: Secondary | ICD-10-CM

## 2021-09-12 DIAGNOSIS — E782 Mixed hyperlipidemia: Secondary | ICD-10-CM

## 2021-09-12 DIAGNOSIS — R7303 Prediabetes: Secondary | ICD-10-CM | POA: Diagnosis not present

## 2021-09-12 DIAGNOSIS — Z1231 Encounter for screening mammogram for malignant neoplasm of breast: Secondary | ICD-10-CM | POA: Diagnosis not present

## 2021-09-12 DIAGNOSIS — E669 Obesity, unspecified: Secondary | ICD-10-CM | POA: Insufficient documentation

## 2021-09-12 DIAGNOSIS — Z0000 Encounter for general adult medical examination without abnormal findings: Secondary | ICD-10-CM | POA: Insufficient documentation

## 2021-09-12 MED ORDER — EPINEPHRINE 0.3 MG/0.3ML IJ SOAJ: 0.3000 mg | INTRAMUSCULAR | 4 refills | Status: DC | PRN

## 2021-09-12 NOTE — Assessment & Plan Note (Signed)
UTD on dental °UTD on vision °Things to do to keep yourself healthy  °- Exercise at least 30-45 minutes a day, 3-4 days a week.  °- Eat a low-fat diet with lots of fruits and vegetables, up to 7-9 servings per day.  °- Seatbelts can save your life. Wear them always.  °- Smoke detectors on every level of your home, check batteries every year.  °- Eye Doctor - have an eye exam every 1-2 years  °- Safe sex - if you may be exposed to STDs, use a condom.  °- Alcohol -  If you drink, do it moderately, less than 2 drinks per day.  °- Health Care Power of Attorney. Choose someone to speak for you if you are not able.  °- Depression is common in our stressful world.If you're feeling down or losing interest in things you normally enjoy, please come in for a visit.  °- Violence - If anyone is threatening or hurting you, please call immediately. ° ° °

## 2021-09-12 NOTE — Assessment & Plan Note (Signed)
Body mass index is 31.63 kg/m. Discussed importance of healthy weight management Discussed diet and exercise

## 2021-09-12 NOTE — Assessment & Plan Note (Signed)
Continue to recommend balanced, lower carb meals. Smaller meal size, adding snacks. Choosing water as drink of choice and increasing purposeful exercise.

## 2021-09-12 NOTE — Assessment & Plan Note (Signed)
Chronic, stable Reports additional tick bite Request for repeat lab test Request for epi pen fill

## 2021-09-12 NOTE — Patient Instructions (Signed)
Please call and schedule your mammogram:  [facility]  [address], [address] [facility] [place],  [place]  [address] Get Driving Directions Main: [phone]  Sunday:Closed Monday:7:20 AM - 5:00 PM Tuesday:7:20 AM - 5:00 PM Wednesday:7:20 AM - 5:00 PM Thursday:7:20 AM - 5:00 PM Friday:7:20 AM - 4:30 PM Saturday:Closed  

## 2021-09-12 NOTE — Assessment & Plan Note (Signed)
Chronic, previous elevated Repeat NFLP On 10 lipitor The 10-year ASCVD risk score (Arnett DK, et al., 2019) is: 1.7%   Values used to calculate the score:     Age: 56 years     Sex: Female     Is Non-Hispanic African American: No     Diabetic: No     Tobacco smoker: No     Systolic Blood Pressure: 970 mmHg     Is BP treated: No     HDL Cholesterol: 42 mg/dL     Total Cholesterol: 199 mg/dL

## 2021-09-12 NOTE — Assessment & Plan Note (Signed)
Due for screening for mammogram, denies breast concerns, provided with phone number to call and schedule appointment for mammogram. Encouraged to repeat breast cancer screening every 1-2 years.  

## 2021-09-15 LAB — COMPREHENSIVE METABOLIC PANEL
ALT: 21 IU/L (ref 0–32)
AST: 22 IU/L (ref 0–40)
Albumin/Globulin Ratio: 1.6 (ref 1.2–2.2)
Albumin: 4.7 g/dL (ref 3.8–4.9)
Alkaline Phosphatase: 70 IU/L (ref 44–121)
BUN/Creatinine Ratio: 24 — ABNORMAL HIGH (ref 9–23)
BUN: 17 mg/dL (ref 6–24)
Bilirubin Total: 0.4 mg/dL (ref 0.0–1.2)
CO2: 27 mmol/L (ref 20–29)
Calcium: 10.3 mg/dL — ABNORMAL HIGH (ref 8.7–10.2)
Chloride: 101 mmol/L (ref 96–106)
Creatinine, Ser: 0.71 mg/dL (ref 0.57–1.00)
Globulin, Total: 3 g/dL (ref 1.5–4.5)
Glucose: 77 mg/dL (ref 70–99)
Potassium: 4 mmol/L (ref 3.5–5.2)
Sodium: 142 mmol/L (ref 134–144)
Total Protein: 7.7 g/dL (ref 6.0–8.5)
eGFR: 102 mL/min/{1.73_m2} (ref >59)

## 2021-09-15 LAB — LIPID PANEL
Chol/HDL Ratio: 5.3 ratio — ABNORMAL HIGH (ref 0.0–4.4)
Cholesterol, Total: 191 mg/dL (ref 100–199)
HDL: 36 mg/dL — ABNORMAL LOW (ref >39)
LDL Chol Calc (NIH): 129 mg/dL — ABNORMAL HIGH (ref 0–99)
Triglycerides: 144 mg/dL (ref 0–149)
VLDL Cholesterol Cal: 26 mg/dL (ref 5–40)

## 2021-09-15 LAB — HEMOGLOBIN A1C
Est. average glucose Bld gHb Est-mCnc: 120 mg/dL
Hgb A1c MFr Bld: 5.8 % — ABNORMAL HIGH (ref 4.8–5.6)

## 2021-09-15 LAB — CBC WITH DIFFERENTIAL/PLATELET
Basophils Absolute: 0.1 10*3/uL (ref 0.0–0.2)
Basos: 0 %
EOS (ABSOLUTE): 0.2 10*3/uL (ref 0.0–0.4)
Eos: 2 %
Hematocrit: 48.9 % — ABNORMAL HIGH (ref 34.0–46.6)
Hemoglobin: 17 g/dL — ABNORMAL HIGH (ref 11.1–15.9)
Immature Grans (Abs): 0 10*3/uL (ref 0.0–0.1)
Immature Granulocytes: 0 %
Lymphocytes Absolute: 3.7 10*3/uL — ABNORMAL HIGH (ref 0.7–3.1)
Lymphs: 33 %
MCH: 30 pg (ref 26.6–33.0)
MCHC: 34.8 g/dL (ref 31.5–35.7)
MCV: 86 fL (ref 79–97)
Monocytes Absolute: 0.7 10*3/uL (ref 0.1–0.9)
Monocytes: 6 %
Neutrophils Absolute: 6.9 10*3/uL (ref 1.4–7.0)
Neutrophils: 59 %
Platelets: 256 10*3/uL (ref 150–450)
RBC: 5.66 x10E6/uL — ABNORMAL HIGH (ref 3.77–5.28)
RDW: 13.2 % (ref 11.7–15.4)
WBC: 11.5 10*3/uL — ABNORMAL HIGH (ref 3.4–10.8)

## 2021-09-15 LAB — ALPHA-GAL PANEL
Allergen Lamb IgE: 1.38 kU/L — AB
Beef IgE: 1.69 kU/L — AB
IgE (Immunoglobulin E), Serum: 38 IU/mL (ref 6–495)
O215-IgE Alpha-Gal: 5.8 kU/L — AB
Pork IgE: 1.04 kU/L — AB

## 2021-09-15 LAB — TSH+FREE T4
Free T4: 1.43 ng/dL (ref 0.82–1.77)
TSH: 0.997 u[IU]/mL (ref 0.450–4.500)

## 2021-09-15 NOTE — Progress Notes (Signed)
Labs are stable; alpha gal is pending.  Elevation remains in calcium.   Elevation remains in infection cells and red cells.   Bad/LDL cholesterol is elevated. Good/HDL cholesterol is low. The 10-year ASCVD risk score (Arnett DK, et al., 2019) is: 1.9%   Values used to calculate the score:     Age: 56 years     Sex: Female     Is Non-Hispanic African American: No     Diabetic: No     Tobacco smoker: No     Systolic Blood Pressure: 511 mmHg     Is BP treated: No     HDL Cholesterol: 36 mg/dL     Total Cholesterol: 191 mg/dL  2% stroke/heart attack risk in 10 years.  Remain pre-diabetic. Continue to recommend balanced, lower carb meals. Smaller meal size, adding snacks. Choosing water as drink of choice and increasing purposeful exercise.  Please call and schedule your mammogram:  [facility]  [place], [place],  [place]  [address] Get Driving Directions Main: [phone]  Sunday:Closed Monday:7:20 AM - 5:00 PM Tuesday:7:20 AM - 5:00 PM Wednesday:7:20 AM - 5:00 PM Thursday:7:20 AM - 5:00 PM Friday:7:20 AM - 4:30 PM Saturday:Closed  Please let us know if you have any questions.  Thank you, Gwyneth Sprout, [address] [place], [place] [address] [phone] (phone) [phone] (fax) [place]

## 2021-09-15 NOTE — Progress Notes (Signed)
Alpha gal is increased; as expected.  Gwyneth Sprout, [address] [place], [place] [address] [phone] (phone) [phone] (fax) [place]

## 2021-10-06 ENCOUNTER — Ambulatory Visit
Admission: RE | Admit: 2021-10-06 | Discharge: 2021-10-06 | Disposition: A | Discharge status: Home / Self Care | Payer: 59 | Source: Ambulatory Visit | Attending: Family Medicine | Admitting: Family Medicine

## 2021-10-06 DIAGNOSIS — Z1231 Encounter for screening mammogram for malignant neoplasm of breast: Secondary | ICD-10-CM | POA: Insufficient documentation

## 2021-10-07 ENCOUNTER — Other Ambulatory Visit: Payer: Self-pay | Admitting: Family Medicine

## 2021-10-07 DIAGNOSIS — R609 Edema, unspecified: Secondary | ICD-10-CM

## 2021-10-07 DIAGNOSIS — E7800 Pure hypercholesterolemia, unspecified: Secondary | ICD-10-CM

## 2021-10-08 MED ORDER — ATORVASTATIN CALCIUM 10 MG PO TABS: 10.0000 mg | ORAL_TABLET | Freq: Every day | ORAL | 0 refills | Status: DC

## 2021-10-08 NOTE — Progress Notes (Signed)
[place]  Normal mammogram; repeat in 1 year.  Please let us know if you have any questions.  Thank you,  Tally Joe, FNP

## 2021-11-09 ENCOUNTER — Other Ambulatory Visit: Payer: Self-pay | Admitting: Family Medicine

## 2021-11-09 DIAGNOSIS — R609 Edema, unspecified: Secondary | ICD-10-CM

## 2022-01-27 ENCOUNTER — Other Ambulatory Visit: Payer: Self-pay | Admitting: Family Medicine

## 2022-01-27 DIAGNOSIS — R609 Edema, unspecified: Secondary | ICD-10-CM

## 2022-01-27 DIAGNOSIS — E7800 Pure hypercholesterolemia, unspecified: Secondary | ICD-10-CM

## 2022-02-23 HISTORY — PX: HIATAL HERNIA REPAIR: SHX195

## 2022-04-13 ENCOUNTER — Ambulatory Visit
Admission: EM | Admit: 2022-04-13 | Discharge: 2022-04-13 | Disposition: A | Discharge status: Home / Self Care | Payer: 59 | Attending: Family Medicine | Admitting: Family Medicine

## 2022-04-13 DIAGNOSIS — J111 Influenza due to unidentified influenza virus with other respiratory manifestations: Secondary | ICD-10-CM

## 2022-04-13 DIAGNOSIS — R051 Acute cough: Secondary | ICD-10-CM

## 2022-04-13 DIAGNOSIS — Z1152 Encounter for screening for COVID-19: Secondary | ICD-10-CM | POA: Diagnosis not present

## 2022-04-13 LAB — RAPID INFLUENZA A&B ANTIGENS
Influenza A (ARMC): NEGATIVE
Influenza B (ARMC): NEGATIVE

## 2022-04-13 LAB — SARS CORONAVIRUS 2 BY RT PCR: SARS Coronavirus 2 by RT PCR: NEGATIVE

## 2022-04-13 MED ORDER — AZITHROMYCIN 250 MG PO TABS: 250.0000 mg | ORAL_TABLET | Freq: Every day | ORAL | 0 refills | Status: DC

## 2022-04-13 MED ORDER — PREDNISONE 10 MG (21) PO TBPK: ORAL_TABLET | Freq: Every day | ORAL | 0 refills | Status: DC

## 2022-04-13 MED ORDER — PROMETHAZINE-DM 6.25-15 MG/5ML PO SYRP: 5.0000 mL | ORAL_SOLUTION | Freq: Four times a day (QID) | ORAL | 0 refills | Status: DC | PRN

## 2022-04-13 NOTE — ED Triage Notes (Signed)
Pt c/o fever,bodyaches & chills x3 days.

## 2022-04-13 NOTE — ED Provider Notes (Signed)
[facility]    CSN: [id number] Arrival date & time: 04/13/22  1730      History   Chief Complaint Chief Complaint  Patient presents with   Fever   Chills   Generalized Body Aches    HPI Sarah Pham is a 57 y.o. female.   HPI   Sarah Pham presents for fever, chills and body aches. Woke up Saturday morning with a fever 101-102 F.  Has body aches. Took Mucinex and Tylenol. The last time she had a fever around 230-3 PM.    Fever : yes Chills: yes Sore throat: no   Cough: yes Sputum: yes Nasal congestion : yes Rhinorrhea: yes Ear pain/pressure: yes  Myalgias: yes Appetite: decreased   Hydration: normal  Abdominal pain: yes but has chronic pain Nausea: no Vomiting: no Diarrhea: yes Rash: No Sleep disturbance: no Headache: yes     Past Medical History:  Diagnosis Date   Hyperlipidemia    Left arm weakness    due to recent RTR   Obesity    PONV (postoperative nausea and vomiting)    Sleep apnea    CPAP   Tick bite    this caused the patient not to be able to eat meat any longer   Wears contact lenses     Patient Active Problem List   Diagnosis Date Noted   Obesity (BMI 30.0-34.9) 09/12/2021   Prediabetes 09/12/2021   Screening mammogram for breast cancer 09/12/2021   Encounter for annual physical exam 09/12/2021   Mixed hyperlipidemia 09/12/2021   Symptomatic mammary hypertrophy 10/10/2019   Benign neoplasm of transverse colon    Status post bariatric surgery 06/11/2017   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   OSA on CPAP 06/11/2017   Viral warts 07/06/2014   COPD (chronic obstructive pulmonary disease) ([place]) 07/06/2014    Past Surgical History:  Procedure Laterality Date   COLONOSCOPY WITH PROPOFOL N/A 03/04/2018   Procedure: COLONOSCOPY WITH BIOPSIES;  Surgeon: [name], MD;  Location: [place];  Service: Endoscopy;  Laterality: N/A;  sleep apnea   DEBRIDEMENT AND CLOSURE WOUND N/A 05/22/2020   Procedure:  Excision of abdominal wound with closure;  Surgeon: Wallace Going, DO;  Location: [place];  Service: Plastics;  Laterality: N/A;   FOOT SURGERY     HIATAL HERNIA REPAIR  02/2022   LAPAROSCOPIC GASTRIC SLEEVE RESECTION     NASAL SINUS SURGERY     OOPHORECTOMY Left    right was not removed   POLYPECTOMY N/A 03/04/2018   Procedure: POLYPECTOMY;  Surgeon: [name], MD;  Location: [place];  Service: Endoscopy;  Laterality: N/A;   ROTATOR CUFF REPAIR Left 12/31/2017   [facility] Dr Harlow Mares.   VAGINAL HYSTERECTOMY  1995    OB History   No obstetric history on file.      Home Medications    Prior to Admission medications   Medication Sig Start Date End Date Taking? Authorizing Provider  atorvastatin (LIPITOR) 10 MG tablet TAKE 1 TABLET(10 MG) BY MOUTH DAILY 01/27/22  Yes Tally Joe T, FNP  azithromycin (ZITHROMAX Z-PAK) 250 MG tablet Take 1 tablet (250 mg total) by mouth daily. Take 2 tablets on day 1 04/13/22  Yes Llewellyn Choplin, DO  BIOTIN PO Take 2 Doses/Fill by mouth daily.    Yes [provider]  EPINEPHrine 0.3 mg/0.3 mL IJ SOAJ injection Inject 0.3 mg into the muscle as needed for anaphylaxis. 09/12/21  Yes Gwyneth Sprout, FNP  furosemide (LASIX) 20 MG tablet TAKE 1 TABLET BY MOUTH AS NEEDED FOR SWELLING 01/27/22  Yes Gwyneth Sprout, FNP  Multiple Vitamins-Minerals (BARIATRIC MULTIVITAMINS/IRON) CAPS Take 1 tablet by mouth daily.    Yes [provider]  predniSONE (STERAPRED UNI-PAK 21 TAB) 10 MG (21) TBPK tablet Take by mouth daily. Take 6 tabs by mouth daily for 1, then 5 tabs for 1 day, then 4 tabs for 1 day, then 3 tabs for 1 day, then 2 tabs for 1 day, then 1 tab for 1 day. 04/13/22  Yes Kacey Dysert, DO  promethazine-dextromethorphan (PROMETHAZINE-DM) 6.25-15 MG/5ML syrup Take 5 mLs by mouth 4 (four) times daily as needed. 04/13/22  Yes Lyndee Hensen, DO  Semaglutide-Weight Management (WEGOVY) 0.25 MG/0.5ML SOAJ   08/29/21  Yes [provider]    Family History Family History  Problem Relation Age of Onset   Breast cancer Maternal Aunt    Breast cancer Maternal Grandmother     Social History Social History   Tobacco Use   Smoking status: Former    Packs/day: 1.00    Years: 30.00    Total pack years: 30.00    Types: Cigarettes    Quit date: 10/25/2019    Years since quitting: 2.4   Smokeless tobacco: Never   Tobacco comments:    15 cig in a week  Vaping Use   Vaping Use: Never used  Substance Use Topics   Alcohol use: No    Alcohol/week: 0.0 standard drinks of alcohol   Drug use: No     Allergies   Alpha-gal, Galactose, Grape seed, Other, Sulfa antibiotics, Sulfasalazine, and Tomato   Review of Systems Review of Systems: negative unless otherwise stated in HPI.      Physical Exam Triage Vital Signs ED Triage Vitals  Enc Vitals Group     BP 04/13/22 1827 106/72     Pulse Rate 04/13/22 1827 84     Resp 04/13/22 1827 16     Temp 04/13/22 1827 99 F (37.2 C)     Temp Source 04/13/22 1827 Oral     SpO2 04/13/22 1827 94 %     Weight 04/13/22 1826 164 lb (74.4 kg)     Height 04/13/22 1826 '5\' 6"'$  (1.676 m)     Head Circumference --      Peak Flow --      Pain Score 04/13/22 1825 9     Pain Loc --      Pain Edu? --      Excl. in [place]? --    No data found.  Updated Vital Signs BP 106/72 (BP Location: Left Arm)   Pulse 84   Temp 99 F (37.2 C) (Oral)   Resp 16   Ht '5\' 6"'$  (1.676 m)   Wt 74.4 kg   SpO2 94%   BMI 26.47 kg/m   Visual Acuity Right Eye Distance:   Left Eye Distance:   Bilateral Distance:    Right Eye Near:   Left Eye Near:    Bilateral Near:     Physical Exam GEN:     alert, non-toxic appearing female in no distress    HENT:  mucus membranes moist, oropharyngeal without lesions or exudate, mild oropharyngeal erythema EYES:   pupils equal and reactive, no scleral injection or discharge NECK:  normal ROM, no meningismus   RESP:  no  increased work of breathing, diffuse coarse breath sounds CVS:   regular rate and rhythm Skin:   warm and  dry, no rash on visible skin    UC Treatments / Results  Labs (all labs ordered are listed, but only abnormal results are displayed) Labs Reviewed  RAPID INFLUENZA A&B ANTIGENS  SARS CORONAVIRUS 2 BY RT PCR    EKG   Radiology No results found.  Procedures Procedures (including critical care time)  Medications Ordered in UC Medications - No data to display  Initial Impression / Assessment and Plan / UC Course  I have reviewed the triage vital signs and the nursing notes.  Pertinent labs & imaging results that were available during my care of the patient were reviewed by me and considered in my medical decision making (see chart for details).       Pt is a 57 y.o. female who presents for 3 days of respiratory symptoms. [place] is afebrile here without recent antipyretics. Satting well on room air. Overall pt is non-toxic appearing, well hydrated, without respiratory distress. Pulmonary exam with coarse breathe sounds.  COVID and influenza testing obtained and were negative.  Given lung exam and fevers we will treat for community-acquired pneumonia with azithromycin and prednisone.  Promethazine DM given for cough. Typical duration of symptoms discussed.   Return and ED precautions given and voiced understanding. Discussed MDM, treatment plan and plan for follow-up with patient who agrees with plan.     Final Clinical Impressions(s) / UC Diagnoses   Final diagnoses:  Influenza-like illness  Acute cough     Discharge Instructions      Your COVID and influenza tests are negative. Stop by the pharmacy to pick up your prescriptions.  Follow up with your primary care provider as needed.  Continue Motrin and Tylenol for fever and body aches.       ED Prescriptions     Medication Sig Dispense Auth. Provider   azithromycin (ZITHROMAX Z-PAK) 250 MG tablet Take 1  tablet (250 mg total) by mouth daily. Take 2 tablets on day 1 6 tablet Ryen Rhames, DO   predniSONE (STERAPRED UNI-PAK 21 TAB) 10 MG (21) TBPK tablet Take by mouth daily. Take 6 tabs by mouth daily for 1, then 5 tabs for 1 day, then 4 tabs for 1 day, then 3 tabs for 1 day, then 2 tabs for 1 day, then 1 tab for 1 day. 21 tablet Kaelen Brennan, DO   promethazine-dextromethorphan (PROMETHAZINE-DM) 6.25-15 MG/5ML syrup Take 5 mLs by mouth 4 (four) times daily as needed. 118 mL Lyndee Hensen, DO      PDMP not reviewed this encounter.   Lyndee Hensen, DO 04/18/22 1645

## 2022-04-13 NOTE — Discharge Instructions (Addendum)
Your COVID and influenza tests are negative. Stop by the pharmacy to pick up your prescriptions.  Follow up with your primary care provider as needed.  Continue Motrin and Tylenol for fever and body aches.

## 2022-04-27 ENCOUNTER — Other Ambulatory Visit: Payer: Self-pay | Admitting: Family Medicine

## 2022-04-27 DIAGNOSIS — R609 Edema, unspecified: Secondary | ICD-10-CM

## 2022-04-27 DIAGNOSIS — E7800 Pure hypercholesterolemia, unspecified: Secondary | ICD-10-CM

## 2022-07-26 ENCOUNTER — Other Ambulatory Visit: Payer: Self-pay | Admitting: Family Medicine

## 2022-07-26 DIAGNOSIS — R609 Edema, unspecified: Secondary | ICD-10-CM

## 2022-07-26 DIAGNOSIS — E7800 Pure hypercholesterolemia, unspecified: Secondary | ICD-10-CM

## 2022-07-27 NOTE — Telephone Encounter (Signed)
Requested Prescriptions  Pending Prescriptions Disp Refills   atorvastatin (LIPITOR) 10 MG tablet [Pharmacy Med Name: ATORVASTATIN 10MG  TABLETS] 90 tablet 0    Sig: TAKE 1 TABLET(10 MG) BY MOUTH DAILY     Cardiovascular:  Antilipid - Statins Failed - 07/26/2022  6:21 AM      Failed - Lipid Panel in normal range within the last 12 months    Cholesterol, Total  Date Value Ref Range Status  09/12/2021 191 100 - 199 mg/dL Final   LDL Chol Calc (NIH)  Date Value Ref Range Status  09/12/2021 129 (H) 0 - 99 mg/dL Final   HDL  Date Value Ref Range Status  09/12/2021 36 (L) >39 mg/dL Final   Triglycerides  Date Value Ref Range Status  09/12/2021 144 0 - 149 mg/dL Final         Passed - Patient is not pregnant      Passed - Valid encounter within last 12 months    Recent Outpatient Visits           10 months ago Encounter for annual physical exam   [place] [facility] Merita Norton T, FNP   1 year ago Acute maxillary sinusitis, recurrence not specified   [place] [facility] Mecum, Oswaldo Conroy, PA-C   1 year ago Encounter for annual physical exam   [place] [facility] [place], Marzella Schlein, MD   2 years ago Viral upper respiratory tract infection   [place] [facility] Flinchum, Eula Fried, FNP   2 years ago Annual physical exam   [facility] Joycelyn Man M, PA-C               furosemide (LASIX) 20 MG tablet [Pharmacy Med Name: FUROSEMIDE 20MG  TABLETS] 90 tablet     Sig: TAKE 1 TABLET BY MOUTH AS NEEDED     Cardiovascular:  Diuretics - Loop Failed - 07/26/2022  6:21 AM      Failed - K in normal range and within 180 days    Potassium  Date Value Ref Range Status  09/12/2021 4.0 3.5 - 5.2 mmol/L Final         Failed - Ca in normal range and within 180 days    Calcium  Date Value Ref Range Status  09/12/2021 10.3 (H) 8.7 - 10.2 mg/dL Final         Failed - Na in normal  range and within 180 days    Sodium  Date Value Ref Range Status  09/12/2021 142 134 - 144 mmol/L Final         Failed - Cr in normal range and within 180 days    Creatinine, Ser  Date Value Ref Range Status  09/12/2021 0.71 0.57 - 1.00 mg/dL Final         Failed - Cl in normal range and within 180 days    Chloride  Date Value Ref Range Status  09/12/2021 101 96 - 106 mmol/L Final         Failed - Mg Level in normal range and within 180 days    No results found for: "MG"       Failed - Valid encounter within last 6 months    Recent Outpatient Visits           10 months ago Encounter for annual physical exam   [facility] Jacky Kindle, FNP   1 year ago Acute  maxillary sinusitis, recurrence not specified   [place] [facility] Mecum, Oswaldo Conroy, PA-C   1 year ago Encounter for annual physical exam   [place] [facility] [place], Marzella Schlein, MD   2 years ago Viral upper respiratory tract infection   [place] [facility] Flinchum, Eula Fried, FNP   2 years ago Annual physical exam   [facility] Joycelyn Man M, [place]              Passed - Last BP in normal range    BP Readings from Last 1 Encounters:  04/13/22 106/72

## 2022-07-27 NOTE — Telephone Encounter (Signed)
Requested medication (s) are due for refill today: yes  Requested medication (s) are on the active medication list: yes  Last refill:  04/28/22 #90  Future visit scheduled: no  Notes to clinic:  overdue lab work   Requested Prescriptions  Pending Prescriptions Disp Refills   furosemide (LASIX) 20 MG tablet [Pharmacy Med Name: FUROSEMIDE 20MG  TABLETS] 90 tablet     Sig: TAKE 1 TABLET BY MOUTH AS NEEDED     Cardiovascular:  Diuretics - Loop Failed - 07/26/2022  6:21 AM      Failed - K in normal range and within 180 days    Potassium  Date Value Ref Range Status  09/12/2021 4.0 3.5 - 5.2 mmol/L Final         Failed - Ca in normal range and within 180 days    Calcium  Date Value Ref Range Status  09/12/2021 10.3 (H) 8.7 - 10.2 mg/dL Final         Failed - Na in normal range and within 180 days    Sodium  Date Value Ref Range Status  09/12/2021 142 134 - 144 mmol/L Final         Failed - Cr in normal range and within 180 days    Creatinine, Ser  Date Value Ref Range Status  09/12/2021 0.71 0.57 - 1.00 mg/dL Final         Failed - Cl in normal range and within 180 days    Chloride  Date Value Ref Range Status  09/12/2021 101 96 - 106 mmol/L Final         Failed - Mg Level in normal range and within 180 days    No results found for: "MG"       Failed - Valid encounter within last 6 months    Recent Outpatient Visits           10 months ago Encounter for annual physical exam   [place] [facility] Jacky Kindle, FNP   1 year ago Acute maxillary sinusitis, recurrence not specified   [place] [facility] Mecum, Oswaldo Conroy, PA-C   1 year ago Encounter for annual physical exam   [place] [facility] [place], Marzella Schlein, MD   2 years ago Viral upper respiratory tract infection    [facility] Flinchum, Eula Fried, FNP   2 years ago Annual physical exam   [facility]  Joycelyn Man M, [place]              Passed - Last BP in normal range    BP Readings from Last 1 Encounters:  04/13/22 106/72         Signed Prescriptions Disp Refills   atorvastatin (LIPITOR) 10 MG tablet 90 tablet 0    Sig: TAKE 1 TABLET(10 MG) BY MOUTH DAILY     Cardiovascular:  Antilipid - Statins Failed - 07/26/2022  6:21 AM      Failed - Lipid Panel in normal range within the last 12 months    Cholesterol, Total  Date Value Ref Range Status  09/12/2021 191 100 - 199 mg/dL Final   LDL Chol Calc (NIH)  Date Value Ref Range Status  09/12/2021 129 (H) 0 - 99 mg/dL Final   HDL  Date Value Ref Range Status  09/12/2021 36 (L) >39 mg/dL Final   Triglycerides  Date Value Ref Range Status  09/12/2021 144 0 - 149 mg/dL Final  Passed - Patient is not pregnant      Passed - Valid encounter within last 12 months    Recent Outpatient Visits           10 months ago Encounter for annual physical exam   [place] [facility] [facility] Merita Norton T, FNP   1 year ago Acute maxillary sinusitis, recurrence not specified   [place] [facility] Mecum, Oswaldo Conroy, PA-C   1 year ago Encounter for annual physical exam   [place] [facility] [place], Marzella Schlein, MD   2 years ago Viral upper respiratory tract infection   [place] [facility] Flinchum, Eula Fried, FNP   2 years ago Annual physical exam   [facility] Joycelyn Man [place], [place]

## 2022-09-28 ENCOUNTER — Telehealth: Payer: Self-pay

## 2022-09-28 NOTE — Telephone Encounter (Signed)
Patient left a message at 10:57am states she got a Clinical cytogeneticist message and stating she is due for repeat colonoscopy with Dr. Servando Snare

## 2022-10-05 LAB — COMPREHENSIVE METABOLIC PANEL
Albumin: 4.8 (ref 3.5–5.0)
Calcium: 10.3 (ref 8.7–10.7)
Globulin: 3.1
eGFR: 103

## 2022-10-05 LAB — CBC AND DIFFERENTIAL
HCT: 51 — AB (ref 36–46)
Hemoglobin: 17.4 — AB (ref 12.0–16.0)
WBC: 12.3

## 2022-10-05 LAB — LIPID PANEL
Cholesterol: 174 (ref 0–200)
HDL: 45 (ref 35–70)
LDL Cholesterol: 111
Triglycerides: 99 (ref 40–160)

## 2022-10-05 LAB — HEPATIC FUNCTION PANEL
ALT: 26 U/L (ref 7–35)
AST: 19 (ref 13–35)
Alkaline Phosphatase: 86 (ref 25–125)
Bilirubin, Total: 0.5

## 2022-10-05 LAB — BASIC METABOLIC PANEL
BUN: 20 (ref 4–21)
Chloride: 99 (ref 99–108)
Creatinine: 0.7 (ref 0.5–1.1)
Glucose: 77
Potassium: 4.4 meq/L (ref 3.5–5.1)
Sodium: 142 (ref 137–147)

## 2022-10-05 LAB — CBC: RBC: 5.62 — AB (ref 3.87–5.11)

## 2022-10-05 LAB — VITAMIN B12: Vitamin B-12: 1134

## 2022-10-05 LAB — HEMOGLOBIN A1C: Hemoglobin A1C: 5.1

## 2022-10-24 ENCOUNTER — Other Ambulatory Visit: Payer: Self-pay | Admitting: Family Medicine

## 2022-10-24 DIAGNOSIS — R609 Edema, unspecified: Secondary | ICD-10-CM

## 2022-10-24 DIAGNOSIS — E7800 Pure hypercholesterolemia, unspecified: Secondary | ICD-10-CM

## 2022-10-27 ENCOUNTER — Other Ambulatory Visit: Payer: Self-pay | Admitting: Family Medicine

## 2022-10-27 DIAGNOSIS — R609 Edema, unspecified: Secondary | ICD-10-CM

## 2022-10-27 NOTE — Telephone Encounter (Signed)
OV needed for additional refills, 30 day supply given until OV can be made.  Requested Prescriptions  Pending Prescriptions Disp Refills   atorvastatin (LIPITOR) 10 MG tablet [Pharmacy Med Name: ATORVASTATIN 10MG  TABLETS] 30 tablet 0    Sig: TAKE 1 TABLET(10 MG) BY MOUTH DAILY     Cardiovascular:  Antilipid - Statins Failed - 10/24/2022  6:20 AM      Failed - Valid encounter within last 12 months    Recent Outpatient Visits           1 year ago Encounter for annual physical exam   [place] [facility] Merita Norton T, FNP   1 year ago Acute maxillary sinusitis, recurrence not specified   [place] [facility] Mecum, Oswaldo Conroy, PA-C   2 years ago Encounter for annual physical exam   [place] [facility] [place], Marzella Schlein, MD   2 years ago Viral upper respiratory tract infection   [place] [facility] Flinchum, Eula Fried, FNP   3 years ago Annual physical exam   [facility] Joycelyn Man M, [place]              Failed - Lipid Panel in normal range within the last 12 months    Cholesterol, Total  Date Value Ref Range Status  09/12/2021 191 100 - 199 mg/dL Final   LDL Chol Calc (NIH)  Date Value Ref Range Status  09/12/2021 129 (H) 0 - 99 mg/dL Final   HDL  Date Value Ref Range Status  09/12/2021 36 (L) >39 mg/dL Final   Triglycerides  Date Value Ref Range Status  09/12/2021 144 0 - 149 mg/dL Final         Passed - Patient is not pregnant       furosemide (LASIX) 20 MG tablet [Pharmacy Med Name: FUROSEMIDE 20MG  TABLETS] 30 tablet 0    Sig: TAKE 1 TABLET BY MOUTH AS NEEDED     Cardiovascular:  Diuretics - Loop Failed - 10/24/2022  6:20 AM      Failed - K in normal range and within 180 days    Potassium  Date Value Ref Range Status  09/12/2021 4.0 3.5 - 5.2 mmol/L Final         Failed - Ca in normal range and within 180 days    Calcium  Date Value Ref Range  Status  09/12/2021 10.3 (H) 8.7 - 10.2 mg/dL Final         Failed - Na in normal range and within 180 days    Sodium  Date Value Ref Range Status  09/12/2021 142 134 - 144 mmol/L Final         Failed - Cr in normal range and within 180 days    Creatinine, Ser  Date Value Ref Range Status  09/12/2021 0.71 0.57 - 1.00 mg/dL Final         Failed - Cl in normal range and within 180 days    Chloride  Date Value Ref Range Status  09/12/2021 101 96 - 106 mmol/L Final         Failed - Mg Level in normal range and within 180 days    No results found for: "MG"       Failed - Valid encounter within last 6 months    Recent Outpatient Visits           1 year ago Encounter for annual physical exam  [facility] Merita Norton T, FNP   1 year ago Acute maxillary sinusitis, recurrence not specified   [place] [facility] Mecum, Oswaldo Conroy, PA-C   2 years ago Encounter for annual physical exam   [place] [facility] [place], Marzella Schlein, MD   2 years ago Viral upper respiratory tract infection   [place] [facility] Flinchum, Eula Fried, FNP   3 years ago Annual physical exam   [facility] Joycelyn Man M, [place]              Passed - Last BP in normal range    BP Readings from Last 1 Encounters:  04/13/22 106/72

## 2022-10-28 ENCOUNTER — Other Ambulatory Visit
Admission: RE | Admit: 2022-10-28 | Discharge: 2022-10-28 | Disposition: A | Discharge status: Home / Self Care | Payer: 59 | Source: Ambulatory Visit | Attending: Ophthalmology | Admitting: Ophthalmology

## 2022-10-28 DIAGNOSIS — H168 Other keratitis: Secondary | ICD-10-CM | POA: Diagnosis present

## 2022-10-29 ENCOUNTER — Other Ambulatory Visit
Admission: RE | Admit: 2022-10-29 | Discharge: 2022-10-29 | Disposition: A | Discharge status: Home / Self Care | Payer: 59 | Source: Ambulatory Visit | Attending: Ophthalmology | Admitting: Ophthalmology

## 2022-10-29 DIAGNOSIS — H168 Other keratitis: Secondary | ICD-10-CM | POA: Diagnosis present

## 2022-10-30 ENCOUNTER — Ambulatory Visit: Payer: 59 | Admitting: Family Medicine

## 2022-10-31 LAB — EYE CULTURE W GRAM STAIN: Culture: NO GROWTH

## 2022-11-02 LAB — EYE CULTURE W GRAM STAIN

## 2022-11-02 NOTE — Group Note (Deleted)

## 2022-11-10 ENCOUNTER — Encounter: Payer: Self-pay | Admitting: Family Medicine

## 2022-11-10 ENCOUNTER — Ambulatory Visit (INDEPENDENT_AMBULATORY_CARE_PROVIDER_SITE_OTHER): Payer: 59 | Admitting: Family Medicine

## 2022-11-10 VITALS — BP 114/67 | HR 67 | Ht 66.0 in | Wt 160.8 lb

## 2022-11-10 DIAGNOSIS — Z0000 Encounter for general adult medical examination without abnormal findings: Secondary | ICD-10-CM | POA: Diagnosis not present

## 2022-11-10 DIAGNOSIS — E782 Mixed hyperlipidemia: Secondary | ICD-10-CM | POA: Diagnosis not present

## 2022-11-10 DIAGNOSIS — R7303 Prediabetes: Secondary | ICD-10-CM | POA: Diagnosis not present

## 2022-11-10 DIAGNOSIS — Z1211 Encounter for screening for malignant neoplasm of colon: Secondary | ICD-10-CM | POA: Insufficient documentation

## 2022-11-10 DIAGNOSIS — Z1231 Encounter for screening mammogram for malignant neoplasm of breast: Secondary | ICD-10-CM | POA: Insufficient documentation

## 2022-11-10 DIAGNOSIS — F17210 Nicotine dependence, cigarettes, uncomplicated: Secondary | ICD-10-CM | POA: Diagnosis not present

## 2022-11-10 DIAGNOSIS — Z2821 Immunization not carried out because of patient refusal: Secondary | ICD-10-CM

## 2022-11-10 DIAGNOSIS — F17200 Nicotine dependence, unspecified, uncomplicated: Secondary | ICD-10-CM | POA: Insufficient documentation

## 2022-11-10 MED ORDER — ROSUVASTATIN CALCIUM 20 MG PO TABS: 20.0000 mg | ORAL_TABLET | Freq: Every day | ORAL | 3 refills | Status: DC

## 2022-11-10 NOTE — Assessment & Plan Note (Signed)
No complaints; referral placed for screening

## 2022-11-10 NOTE — Patient Instructions (Signed)
Please contact [phone] to schedule your mammogram. You will be asked your location preference to have procedure performed. You have two options listed below.  1) [facility] located at [address] [place], [place] [address] 2) [facility] located at [address] [place], [place] [address]  Upon results being received our office will contact you. As well as all results can be viewed through your MyChart. Please feel free to contact us if you have any further questions or concerns.    Thank you for allowing [place] to assist you with proper care. A referral has been placed on your behalf. Our referral coordination team or the office you will be visiting will contact you within the next 10 business days. If you have not received a phone call within 10 business days please let us know so that we can check into this for you.

## 2022-11-10 NOTE — Assessment & Plan Note (Signed)
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 Smaller meal size, adding snacks. Choosing water as drink of choice and increasing purposeful exercise. A1c at goal

## 2022-11-10 NOTE — Progress Notes (Signed)
Complete physical exam  Patient: Sarah Pham   DOB: 1966-01-05   57 y.o. Female  MRN: [id number] Visit Date: 11/10/2022  Today's healthcare provider: Jacky Kindle, FNP  Re-introduced to nurse practitioner role and practice setting.  All questions answered.  Discussed provider/patient relationship and expectations.  Chief Complaint  Patient presents with   Annual Exam    Diet - Only consuming chicken fish and [place] with no additional salt Exercise - 1/2 mile 3 times a week Feeling - well Sleeping - fairly well Concerns - none    Medication Refill   Subjective    Sarah Pham is a 57 y.o. female who presents today for a complete physical exam.  She reports consuming a  fish, chicken, [place]- no added salt  diet.  Walking 1/2 mile, 3x/week  She generally feels well. She reports sleeping well. She does have additional problems to discuss today.   HPI     Annual Exam    Additional comments: Diet - Only consuming chicken fish and [place] with no additional salt Exercise - 1/2 mile 3 times a week Feeling - well Sleeping - fairly well Concerns - none       Last edited by Acey Lav, CMA on 11/10/2022 10:01 AM.      Past Medical History:  Diagnosis Date   Hyperlipidemia    Left arm weakness    due to recent RTR   Obesity    PONV (postoperative nausea and vomiting)    Sleep apnea    CPAP   Tick bite    this caused the patient not to be able to eat meat any longer   Wears contact lenses    Past Surgical History:  Procedure Laterality Date   COLONOSCOPY WITH PROPOFOL N/A 03/04/2018   Procedure: COLONOSCOPY WITH BIOPSIES;  Surgeon: Midge Minium, MD;  Location: [facility];  Service: Endoscopy;  Laterality: N/A;  sleep apnea   DEBRIDEMENT AND CLOSURE WOUND N/A 05/22/2020   Procedure: Excision of abdominal wound with closure;  Surgeon: Peggye Form, DO;  Location: [facility];  Service: Plastics;  Laterality: N/A;   FOOT SURGERY      HIATAL HERNIA REPAIR  02/2022   LAPAROSCOPIC GASTRIC SLEEVE RESECTION     NASAL SINUS SURGERY     OOPHORECTOMY Left    right was not removed   POLYPECTOMY N/A 03/04/2018   Procedure: POLYPECTOMY;  Surgeon: Midge Minium, MD;  Location: [facility];  Service: Endoscopy;  Laterality: N/A;   ROTATOR CUFF REPAIR Left 12/31/2017   [facility] Dr Odis Luster.   VAGINAL HYSTERECTOMY  1995   Social History   Socioeconomic History   Marital status: Married    Spouse name: Not on file   Number of children: Not on file   Years of education: Not on file   Highest education level: Not on file  Occupational History   Not on file  Tobacco Use   Smoking status: Every Day    Current packs/day: 1.00    Average packs/day: 1 pack/day for 32.7 years (32.7 ttl pk-yrs)    Types: Cigarettes    Start date: 02/19/1990   Smokeless tobacco: Never   Tobacco comments:    Report 6-20+ cig/day  Vaping Use   Vaping status: Never Used  Substance and Sexual Activity   Alcohol use: No    Alcohol/week: 0.0 standard drinks of alcohol   Drug use: No   Sexual activity: Yes  Other Topics Concern   Not on file  Social History Narrative   Not on file   Social Determinants of Health   Financial Resource Strain: Not on file  Food Insecurity: Not on file  Transportation Needs: Not on file  Physical Activity: Not on file  Stress: Not on file  Social Connections: Not on file  Intimate Partner Violence: Not on file   Family Status  Relation Name Status   Mother  Alive   Father  Alive   Mat Aunt  (Not Specified)   MGM  (Not Specified)  No partnership data on file   Family History  Problem Relation Age of Onset   Breast cancer Maternal Aunt    Breast cancer Maternal Grandmother    Allergies  Allergen Reactions   Alpha-Gal Anaphylaxis   Galactose Anaphylaxis   Grape Seed Anaphylaxis   Other Other (See Comments)    Tomatoes, dust and mold. 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    EPINEPHrine 0.3 mg/0.3 mL IJ SOAJ injection Inject 0.3 mg into the muscle as needed for anaphylaxis.   furosemide (LASIX) 20 MG tablet TAKE 1 TABLET BY MOUTH AS NEEDED   Multiple Vitamins-Minerals (BARIATRIC MULTIVITAMINS/IRON) CAPS Take 1 tablet by mouth daily.    Semaglutide-Weight Management (WEGOVY) 0.25 MG/0.5ML SOAJ Inject into the skin. Injecting 2.4 mg   [DISCONTINUED] atorvastatin (LIPITOR) 10 MG tablet TAKE 1 TABLET(10 MG) BY MOUTH DAILY   [DISCONTINUED] azithromycin (ZITHROMAX Z-PAK) 250 MG tablet Take 1 tablet (250 mg total) by mouth daily. Take 2 tablets on day 1 (Patient not taking: Reported on 11/10/2022)   [DISCONTINUED] predniSONE (STERAPRED UNI-PAK 21 TAB) 10 MG (21) TBPK tablet Take by mouth daily. Take 6 tabs by mouth daily for 1, then 5 tabs for 1 day, then 4 tabs for 1 day, then 3 tabs for 1 day, then 2 tabs for 1 day, then 1 tab for 1 day. (Patient not taking: Reported on 11/10/2022)   [DISCONTINUED] promethazine-dextromethorphan (PROMETHAZINE-DM) 6.25-15 MG/5ML syrup Take 5 mLs by mouth 4 (four) times daily as needed. (Patient not taking: Reported on 11/10/2022)   No facility-administered medications prior to visit.    Review of Systems    Objective    BP 114/67 (BP Location: Left Arm, Patient Position: Sitting, Cuff Size: Normal)   Pulse 67   Ht 5\' 6"  (1.676 m)   Wt 160 lb 12.8 oz (72.9 kg)   SpO2 98%   BMI 25.95 kg/m    Physical Exam Vitals and nursing note reviewed.  Constitutional:      General: She is awake. She is not in acute distress.    Appearance: Normal appearance. She is well-developed, well-groomed and overweight. She is not  ill-appearing, toxic-appearing or diaphoretic.  HENT:     Head: Normocephalic and atraumatic.     Jaw: There is normal jaw occlusion. No trismus, tenderness, swelling or pain on movement.     Right Ear: Hearing, tympanic membrane, ear canal and external ear normal. There is no impacted cerumen.     Left Ear: Hearing, tympanic membrane, ear canal and external ear normal. There is no impacted cerumen.  Nose: Nose normal. No congestion or rhinorrhea.     Right Turbinates: Not enlarged, swollen or pale.     Left Turbinates: Not enlarged, swollen or pale.     Right Sinus: No maxillary sinus tenderness or frontal sinus tenderness.     Left Sinus: No maxillary sinus tenderness or frontal sinus tenderness.     Mouth/Throat:     Lips: Pink.     Mouth: Mucous membranes are moist. No injury.     Tongue: No lesions.     Pharynx: Oropharynx is clear. Uvula midline. No pharyngeal swelling, oropharyngeal exudate, posterior oropharyngeal erythema or uvula swelling.     Tonsils: No tonsillar exudate or tonsillar abscesses.  Eyes:     General: Lids are normal. Lids are everted, no foreign bodies appreciated. Vision grossly intact. Gaze aligned appropriately. No allergic shiner or visual field deficit.       Right eye: No discharge.        Left eye: No discharge.     Extraocular Movements: Extraocular movements intact.     Conjunctiva/sclera: Conjunctivae normal.     Right eye: Right conjunctiva is not injected. No exudate.    Left eye: Left conjunctiva is not injected. No exudate.    Pupils: Pupils are equal, round, and reactive to light.  Neck:     Thyroid: No thyroid mass, thyromegaly or thyroid tenderness.     Vascular: No carotid bruit.     Trachea: Trachea normal.  Cardiovascular:     Rate and Rhythm: Normal rate and regular rhythm.     Pulses: Normal pulses.          Carotid pulses are 2+ on the right side and 2+ on the left side.      Radial pulses are 2+ on the right side and 2+ on the  left side.       Dorsalis pedis pulses are 2+ on the right side and 2+ on the left side.       Posterior tibial pulses are 2+ on the right side and 2+ on the left side.     Heart sounds: Normal heart sounds, S1 normal and S2 normal. No murmur heard.    No friction rub. No gallop.  Pulmonary:     Effort: Pulmonary effort is normal. No respiratory distress.     Breath sounds: Normal breath sounds and air entry. No stridor. No wheezing, rhonchi or rales.  Chest:     Chest wall: No tenderness.  Abdominal:     General: Abdomen is flat. Bowel sounds are normal. There is no distension.     Palpations: Abdomen is soft. There is no mass.     Tenderness: There is no abdominal tenderness. There is no right CVA tenderness, left CVA tenderness, guarding or rebound.     Hernia: No hernia is present.  Genitourinary:    Comments: Exam deferred; denies complaints Musculoskeletal:        General: No swelling, tenderness, deformity or signs of injury. Normal range of motion.     Cervical back: Full passive range of motion without pain, normal range of motion and neck supple. No edema, rigidity or tenderness. No muscular tenderness.     Right lower leg: No edema.     Left lower leg: No edema.  Lymphadenopathy:     Cervical: No cervical adenopathy.     Right cervical: No superficial, deep or posterior cervical adenopathy.    Left cervical: No superficial, deep or posterior cervical adenopathy.  Skin:  General: Skin is warm and dry.     Capillary Refill: Capillary refill takes less than 2 seconds.     Coloration: Skin is not jaundiced or pale.     Findings: No bruising, erythema, lesion or rash.  Neurological:     General: No focal deficit present.     Mental Status: She is alert and oriented to person, place, and time. Mental status is at baseline.     GCS: GCS eye subscore is 4. GCS verbal subscore is 5. GCS motor subscore is 6.     Sensory: Sensation is intact. No sensory deficit.     Motor: Motor  function is intact. No weakness.     Coordination: Coordination is intact. Coordination normal.     Gait: Gait is intact. Gait normal.  Psychiatric:        Attention and Perception: Attention and perception normal.        Mood and Affect: Mood and affect normal.        Speech: Speech normal.        Behavior: Behavior normal. Behavior is cooperative.        Thought Content: Thought content normal.        Cognition and Memory: Cognition and memory normal.        Judgment: Judgment normal.     Last depression screening scores    11/10/2022   10:11 AM 09/12/2021    9:57 AM 05/02/2021   10:48 AM  PHQ 2/9 Scores  PHQ - 2 Score 0 0 0   Last fall risk screening    09/12/2021    9:57 AM  Fall Risk   Falls in the past year? 0  Number falls in past yr: 0  Injury with Fall? 0   Last Audit-C alcohol use screening    09/12/2021    9:57 AM  Alcohol Use Disorder Test (AUDIT)  1. How often do you have a drink containing alcohol? 1  2. How many drinks containing alcohol do you have on a typical day when you are drinking? 0  3. How often do you have six or more drinks on one occasion? 1  AUDIT-C Score 2   A score of 3 or more in women, and 4 or more in men indicates increased risk for alcohol abuse, EXCEPT if all of the points are from question 1   Results for orders placed or performed in visit on 11/10/22  Lipid panel  Result Value Ref Range   HDL 45 35 - 70   LDL Cholesterol 111   Vitamin B12  Result Value Ref Range   Vitamin B-12 1,134   Hemoglobin A1c  Result Value Ref Range   Hemoglobin A1C 5.1   CBC and differential  Result Value Ref Range   Hemoglobin 17.4 (A) 12.0 - 16.0   HCT 51 (A) 36 - 46   WBC 12.3   CBC  Result Value Ref Range   RBC 5.62 (A) 3.87 - 5.11  Basic metabolic panel  Result Value Ref Range   Glucose 77    BUN 20 4 - 21   Creatinine 0.7 0.5 - 1.1   Potassium 4.4 3.5 - 5.1 mEq/L   Sodium 142 137 - 147   Chloride 99 99 - 108  Comprehensive metabolic  panel  Result Value Ref Range   Globulin 3.1    eGFR 103    Calcium 10.3 8.7 - 10.7   Albumin 4.8 3.5 - 5.0  Lipid panel  Result Value Ref Range   Triglycerides 99 40 - 160   Cholesterol 174 0 - 200  Hepatic function panel  Result Value Ref Range   Alkaline Phosphatase 86 25 - 125   ALT 26 7 - 35 U/L   AST 19 13 - 35   Bilirubin, Total 0.5     Assessment & Plan    Routine Health Maintenance and Physical Exam  Exercise Activities and Dietary recommendations  Goals   None     Immunization History  Administered Date(s) Administered   Influenza,inj,Quad PF,6+ Mos 12/22/2017, 12/11/2018   PFIZER(Purple Top)SARS-COV-2 Vaccination 06/05/2019, 06/15/2019   Td 02/23/2005   Tdap 07/22/2018    Health Maintenance  Topic Date Due   Lung Cancer Screening  Never done   Zoster Vaccines- Shingrix (1 of 2) Never done   COVID-19 Vaccine (3 - 2023-24 season) 10/25/2022   Colonoscopy  03/05/2023   INFLUENZA VACCINE  05/24/2023 (Originally 09/24/2022)   MAMMOGRAM  10/07/2023   DTaP/Tdap/Td (3 - Td or Tdap) 07/21/2028   Hepatitis C Screening  Completed   HIV Screening  Completed   HPV VACCINES  Aged Out    Discussed health benefits of physical activity, and encouraged her to engage in regular exercise appropriate for her age and condition.  Problem List Items Addressed This Visit       Other   Annual physical exam - Primary    UTD on dental and vision Labs previously completed externally reviewed Recommend mammogram and lung cancer screening as well as colon cancer screening Things to do to keep yourself healthy  - Exercise at least 30-45 minutes a day, 3-4 days a week.  - Eat a low-fat diet with lots of fruits and vegetables, up to 7-9 servings per day.  - Seatbelts can save your life. Wear them always.  - Smoke detectors on every level of your home, check batteries every year.  - Eye Doctor - have an eye exam every 1-2 years  - Safe sex - if you may be exposed to STDs, use a  condom.  - Alcohol -  If you drink, do it moderately, less than 2 drinks per day.  - Health Care Power of Attorney. Choose someone to speak for you if you are not able.  - Depression is common in our stressful world.If you're feeling down or losing interest in things you normally enjoy, please come in for a visit.  - Violence - If anyone is threatening or hurting you, please call immediately.       Relevant Orders   Lipid panel (Completed)   Vitamin B12 (Completed)   Hemoglobin A1c (Completed)   CBC and differential (Completed)   CBC (Completed)   Basic metabolic panel (Completed)   Comprehensive metabolic panel (Completed)   Lipid panel (Completed)   Hepatic function panel (Completed)   Encounter for screening mammogram for malignant neoplasm of breast    Due for screening for mammogram, denies breast concerns, provided with phone number to call and schedule appointment for mammogram. Encouraged to repeat breast cancer screening every 1-2 years.       Relevant Orders   MM 3D SCREENING MAMMOGRAM BILATERAL BREAST   Mixed hyperlipidemia    Chronic; LDL remains elevated Recommend increase in statin strength to reduce ASCVD risk       Relevant Medications   rosuvastatin (CRESTOR) 20 MG tablet   Other Relevant Orders   Lipid panel (Completed)   Prediabetes    Chronic, improved with GLP  for weight mgmt to assist previous complications from paniculectomy F/b weight mgmt Continue to recommend balanced, lower carb meals. Smaller meal size, adding snacks. Choosing water as drink of choice and increasing purposeful exercise. A1c at goal      Relevant Orders   Hemoglobin A1c (Completed)   Screening for colon cancer    No complaints; referral placed for screening       Relevant Orders   Ambulatory referral to Gastroenterology   Tobacco dependence    Chronic, resumed daily use Variable in nature Attributes use d/t stress at work and with disabled spouse Recommend LD lung CT at  this time for early detection       Relevant Orders   Ambulatory Referral Lung Cancer Screening [facility]   Other Visit Diagnoses     Influenza vaccination declined by patient          Return in about 6 months (around 05/10/2023) for chonic disease management.    Leilani Merl, FNP, have reviewed all documentation for this visit. The documentation on 11/10/22 for the exam, diagnosis, procedures, and orders are all accurate and complete.  Jacky Kindle, FNP  [facility] [phone] (phone) [phone] (fax)  [facility]

## 2022-11-10 NOTE — Assessment & Plan Note (Signed)
Chronic; LDL remains elevated Recommend increase in statin strength to reduce ASCVD risk

## 2022-11-10 NOTE — Assessment & Plan Note (Signed)
UTD on dental and vision Labs previously completed externally reviewed Recommend mammogram and lung cancer screening as well as colon cancer screening Things to do to keep yourself healthy  - Exercise at least 30-45 minutes a day, 3-4 days a week.  - Eat a low-fat diet with lots of fruits and vegetables, up to 7-9 servings per day.  - Seatbelts can save your life. Wear them always.  - Smoke detectors on every level of your home, check batteries every year.  - Eye Doctor - have an eye exam every 1-2 years  - Safe sex - if you may be exposed to STDs, use a condom.  - Alcohol -  If you drink, do it moderately, less than 2 drinks per day.  - Health Care Power of Attorney. Choose someone to speak for you if you are not able.  - Depression is common in our stressful world.If you're feeling down or losing interest in things you normally enjoy, please come in for a visit.  - Violence - If anyone is threatening or hurting you, please call immediately.

## 2022-11-10 NOTE — Assessment & Plan Note (Signed)
Chronic, resumed daily use Variable in nature Attributes use d/t stress at work and with disabled spouse Recommend LD lung CT at this time for early detection

## 2022-11-10 NOTE — Assessment & Plan Note (Signed)
Due for screening for mammogram, denies breast concerns, provided with phone number to call and schedule appointment for mammogram. Encouraged to repeat breast cancer screening every 1-2 years.

## 2022-11-11 ENCOUNTER — Telehealth: Payer: Self-pay

## 2022-11-11 NOTE — Telephone Encounter (Signed)
Call patient in December to schedule January colonoscopy with Dr. Servando Snare due 03/05/23.  Thanks, Marcelino Duster

## 2022-11-18 LAB — CULTURE, FUNGUS WITHOUT SMEAR

## 2022-11-19 LAB — CULTURE, FUNGUS WITHOUT SMEAR

## 2022-11-23 ENCOUNTER — Other Ambulatory Visit: Payer: Self-pay | Admitting: Family Medicine

## 2022-11-23 DIAGNOSIS — E7800 Pure hypercholesterolemia, unspecified: Secondary | ICD-10-CM

## 2022-11-23 DIAGNOSIS — R609 Edema, unspecified: Secondary | ICD-10-CM

## 2022-12-07 ENCOUNTER — Ambulatory Visit
Admission: RE | Admit: 2022-12-07 | Discharge: 2022-12-07 | Disposition: A | Discharge status: Home / Self Care | Payer: 59 | Source: Ambulatory Visit | Attending: Family Medicine | Admitting: Family Medicine

## 2022-12-07 DIAGNOSIS — Z1231 Encounter for screening mammogram for malignant neoplasm of breast: Secondary | ICD-10-CM | POA: Diagnosis present

## 2022-12-23 ENCOUNTER — Other Ambulatory Visit: Payer: Self-pay | Admitting: Family Medicine

## 2022-12-23 DIAGNOSIS — R609 Edema, unspecified: Secondary | ICD-10-CM

## 2022-12-23 NOTE — Telephone Encounter (Signed)
Requested Prescriptions  Pending Prescriptions Disp Refills   furosemide (LASIX) 20 MG tablet [Pharmacy Med Name: FUROSEMIDE 20MG  TABLETS] 30 tablet 0    Sig: TAKE 1 TABLET BY MOUTH AS NEEDED     Cardiovascular:  Diuretics - Loop Failed - 12/23/2022  6:21 AM      Failed - Mg Level in normal range and within 180 days    No results found for: "MG"       Passed - K in normal range and within 180 days    Potassium  Date Value Ref Range Status  10/05/2022 4.4 3.5 - 5.1 mEq/L Final         Passed - Ca in normal range and within 180 days    Calcium  Date Value Ref Range Status  10/05/2022 10.3 8.7 - 10.7 Final         Passed - Na in normal range and within 180 days    Sodium  Date Value Ref Range Status  10/05/2022 142 137 - 147 Final         Passed - Cr in normal range and within 180 days    Creatinine  Date Value Ref Range Status  10/05/2022 0.7 0.5 - 1.1 Final   Creatinine, Ser  Date Value Ref Range Status  09/12/2021 0.71 0.57 - 1.00 mg/dL Final         Passed - Cl in normal range and within 180 days    Chloride  Date Value Ref Range Status  10/05/2022 99 99 - 108 Final         Passed - Last BP in normal range    BP Readings from Last 1 Encounters:  11/10/22 114/67         Passed - Valid encounter within last 6 months    Recent Outpatient Visits           1 month ago Annual physical exam   [facility] Jacky Kindle, FNP   1 year ago Encounter for annual physical exam   [facility] Merita Norton T, FNP   1 year ago Acute maxillary sinusitis, recurrence not specified   [facility] Mecum, Oswaldo Conroy, PA-C   2 years ago Encounter for annual physical exam   [place] [facility] Bradley, Marzella Schlein, MD   2 years ago Viral upper respiratory tract infection   [facility] Flinchum, Eula Fried, FNP

## 2023-01-25 ENCOUNTER — Telehealth: Payer: Self-pay

## 2023-01-25 ENCOUNTER — Other Ambulatory Visit: Payer: Self-pay

## 2023-01-25 DIAGNOSIS — K219 Gastro-esophageal reflux disease without esophagitis: Secondary | ICD-10-CM | POA: Insufficient documentation

## 2023-01-25 DIAGNOSIS — Z860100 Personal history of colon polyps, unspecified: Secondary | ICD-10-CM

## 2023-01-25 MED ORDER — NA SULFATE-K SULFATE-MG SULF 17.5-3.13-1.6 GM/177ML PO SOLN: 1.0000 | Freq: Once | ORAL | 0 refills | Status: AC

## 2023-01-25 NOTE — Telephone Encounter (Signed)
Gastroenterology Pre-Procedure Review  Request Date: 03/05/23 Requesting Physician: Dr. Servando Snare  PATIENT REVIEW QUESTIONS: The patient responded to the following health history questions as indicated:    1. Are you having any GI issues? no 2. Do you have a personal history of Polyps? yes (last colonoscopy 03/04/2018 performed by Dr. Servando Snare) 3. Do you have a family history of Colon Cancer or Polyps? no 4. Diabetes Mellitus? no 5. Joint replacements in the past 12 months?no 6. Major health problems in the past 3 months?no 7. Any artificial heart valves, MVP, or defibrillator?no 8. Weight loss meds? Yes Reginal Lutes has been advised to stop 7 days prior to colonoscopy.    MEDICATIONS & ALLERGIES:    Patient reports the following regarding taking any anticoagulation/antiplatelet therapy:   Plavix, Coumadin, Eliquis, Xarelto, Lovenox, Pradaxa, Brilinta, or Effient? no Aspirin? no  Patient confirms/reports the following medications:  Current Outpatient Medications  Medication Sig Dispense Refill   BIOTIN PO Take 2 Doses/Fill by mouth daily.      EPINEPHrine 0.3 mg/0.3 mL IJ SOAJ injection Inject 0.3 mg into the muscle as needed for anaphylaxis. 2 each 4   furosemide (LASIX) 20 MG tablet TAKE 1 TABLET BY MOUTH AS NEEDED 30 tablet 0   Multiple Vitamins-Minerals (BARIATRIC MULTIVITAMINS/IRON) CAPS Take 1 tablet by mouth daily.      rosuvastatin (CRESTOR) 20 MG tablet Take 1 tablet (20 mg total) by mouth daily. 90 tablet 3   Semaglutide-Weight Management (WEGOVY) 0.25 MG/0.5ML SOAJ Inject into the skin. Injecting 2.4 mg     No current facility-administered medications for this visit.    Patient confirms/reports the following allergies:  Allergies  Allergen Reactions   Alpha-Gal Anaphylaxis   Galactose Anaphylaxis   Grape Seed Anaphylaxis   Other Other (See Comments)    Tomatoes, dust and mold. Patient has Alpha Gal- Allergic to all hooved animal   Sulfa Antibiotics Nausea And Vomiting    Sulfasalazine Nausea And Vomiting   Tomato Itching    Itchy throat    No orders of the defined types were placed in this encounter.   AUTHORIZATION INFORMATION Primary Insurance: 1D#: Group #:  Secondary Insurance: 1D#: Group #:  SCHEDULE INFORMATION: Date: 03/05/23 Time: Location: [facility]

## 2023-02-05 ENCOUNTER — Telehealth: Payer: Self-pay

## 2023-02-05 MED ORDER — GOLYTELY 236 G PO SOLR: 4000.0000 mL | Freq: Once | ORAL | 0 refills | Status: AC

## 2023-02-05 NOTE — Telephone Encounter (Signed)
The patient called in wanting to know what's in the prep.

## 2023-02-05 NOTE — Telephone Encounter (Signed)
Returned phone call to patient to provide her with the Suprep Ingeredients as follows:  -Sodium Sulfate is a salt which helps to increase fluid in the intestines to promote the bowel movement -Magnesium Sulfate is a salt also that helps to draw in the water into the intestines to help facilitate the bowel movement -Potassium Sulfate helps to balance the electrolytes during the bowel cleansing process -Citric Acid helps to enhance the solutions effectiveness -Flavoring agent to make prep more palatable.  She returned phone call.  Discussed the ingredients and she said she would feel more comfortable having a prep that didn't contain the Sulfate ingredients.  Informed her I appreciate her letting me this and I will send in a new prescription for Golytely.  I advised that she will still need to drink lots of liquids with the prep because dehydration reasons one to experiences the nausea that she is so concerned about having.  Verbalized understanding. Prep sent.  Thanks,  [place], [place]

## 2023-02-22 NOTE — Anesthesia Preprocedure Evaluation (Signed)
Anesthesia Evaluation    Airway        Dental   Pulmonary Current Smoker          Cardiovascular      Neuro/Psych    GI/Hepatic   Endo/Other    Renal/GU      Musculoskeletal   Abdominal   Peds  Hematology   Anesthesia Other Findings Hyperlipidemia  Obesity Sleep apnea  Left arm weakness Wears contact lenses  PONV (postoperative nausea and vomiting)     Reproductive/Obstetrics                              Anesthesia Physical Anesthesia Plan Anesthesia Quick Evaluation

## 2023-02-23 ENCOUNTER — Other Ambulatory Visit: Payer: Self-pay | Admitting: Family Medicine

## 2023-02-23 DIAGNOSIS — R609 Edema, unspecified: Secondary | ICD-10-CM

## 2023-02-25 ENCOUNTER — Encounter: Payer: Self-pay | Admitting: Gastroenterology

## 2023-02-26 ENCOUNTER — Other Ambulatory Visit: Payer: Self-pay | Admitting: Family Medicine

## 2023-02-26 DIAGNOSIS — R609 Edema, unspecified: Secondary | ICD-10-CM

## 2023-02-26 MED ORDER — FUROSEMIDE 20 MG PO TABS: ORAL_TABLET | ORAL | 0 refills | Status: DC

## 2023-03-05 ENCOUNTER — Ambulatory Visit: Payer: 59 | Admitting: Anesthesiology

## 2023-03-05 ENCOUNTER — Encounter: Payer: Self-pay | Admitting: Gastroenterology

## 2023-03-05 ENCOUNTER — Other Ambulatory Visit: Payer: Self-pay

## 2023-03-05 ENCOUNTER — Encounter
Admission: RE | Disposition: A | Discharge status: Home / Self Care | Payer: Self-pay | Source: Home / Self Care | Attending: Gastroenterology

## 2023-03-05 ENCOUNTER — Ambulatory Visit
Admission: RE | Admit: 2023-03-05 | Discharge: 2023-03-05 | Disposition: A | Discharge status: Home / Self Care | Payer: 59 | Attending: Gastroenterology | Admitting: Gastroenterology

## 2023-03-05 DIAGNOSIS — G4730 Sleep apnea, unspecified: Secondary | ICD-10-CM | POA: Insufficient documentation

## 2023-03-05 DIAGNOSIS — Z860100 Personal history of colon polyps, unspecified: Secondary | ICD-10-CM

## 2023-03-05 DIAGNOSIS — Z1211 Encounter for screening for malignant neoplasm of colon: Secondary | ICD-10-CM | POA: Insufficient documentation

## 2023-03-05 DIAGNOSIS — K219 Gastro-esophageal reflux disease without esophagitis: Secondary | ICD-10-CM | POA: Diagnosis not present

## 2023-03-05 DIAGNOSIS — F17210 Nicotine dependence, cigarettes, uncomplicated: Secondary | ICD-10-CM | POA: Insufficient documentation

## 2023-03-05 DIAGNOSIS — K641 Second degree hemorrhoids: Secondary | ICD-10-CM | POA: Diagnosis not present

## 2023-03-05 DIAGNOSIS — Z860101 Personal history of adenomatous and serrated colon polyps: Secondary | ICD-10-CM | POA: Insufficient documentation

## 2023-03-05 HISTORY — PX: COLONOSCOPY WITH PROPOFOL: SHX5780

## 2023-03-05 SURGERY — COLONOSCOPY WITH PROPOFOL
Anesthesia: General | Site: Rectum

## 2023-03-05 MED ORDER — ONDANSETRON HCL 4 MG/2ML IJ SOLN
INTRAMUSCULAR | Status: AC
  Filled 2023-03-05: qty 2

## 2023-03-05 MED ORDER — PROPOFOL 10 MG/ML IV BOLUS
INTRAVENOUS | Status: AC
  Filled 2023-03-05: qty 20

## 2023-03-05 MED ORDER — ONDANSETRON HCL 4 MG/2ML IJ SOLN
4.0000 mg | Freq: Once | INTRAMUSCULAR | Status: AC
  Administered 2023-03-05: 4 mg via INTRAVENOUS

## 2023-03-05 MED ORDER — STERILE WATER FOR IRRIGATION IR SOLN
Status: DC | PRN
  Administered 2023-03-05: 1

## 2023-03-05 MED ORDER — SODIUM CHLORIDE 0.9 % IV SOLN: INTRAVENOUS | Status: DC

## 2023-03-05 MED ORDER — LIDOCAINE HCL (CARDIAC) PF 100 MG/5ML IV SOSY
PREFILLED_SYRINGE | INTRAVENOUS | Status: DC | PRN
  Administered 2023-03-05: 80 mg via INTRAVENOUS

## 2023-03-05 MED ORDER — LACTATED RINGERS IV SOLN: INTRAVENOUS | Status: DC

## 2023-03-05 MED ORDER — ACETAMINOPHEN 500 MG PO TABS
1000.0000 mg | ORAL_TABLET | Freq: Four times a day (QID) | ORAL | Status: DC | PRN
  Administered 2023-03-05: 1000 mg via ORAL

## 2023-03-05 MED ORDER — PROPOFOL 10 MG/ML IV BOLUS
INTRAVENOUS | Status: DC | PRN
  Administered 2023-03-05: 40 mg via INTRAVENOUS
  Administered 2023-03-05: 30 mg via INTRAVENOUS
  Administered 2023-03-05: 20 mg via INTRAVENOUS
  Administered 2023-03-05: 30 mg via INTRAVENOUS
  Administered 2023-03-05: 100 mg via INTRAVENOUS
  Administered 2023-03-05: 30 mg via INTRAVENOUS
  Administered 2023-03-05: 20 mg via INTRAVENOUS

## 2023-03-05 MED ORDER — ACETAMINOPHEN 500 MG PO TABS
ORAL_TABLET | ORAL | Status: AC
  Filled 2023-03-05: qty 2

## 2023-03-05 SURGICAL SUPPLY — 18 items
CLIP HMST 235XBRD CATH ROT (MISCELLANEOUS) IMPLANT
ELECT REM PT RETURN 9FT ADLT (ELECTROSURGICAL)
ELECTRODE REM PT RTRN 9FT ADLT (ELECTROSURGICAL) IMPLANT
FORCEPS BIOP RAD 4 LRG CAP 4 (CUTTING FORCEPS) IMPLANT
GOWN CVR UNV OPN BCK APRN NK (MISCELLANEOUS) ×2 IMPLANT
INJECTOR VARIJECT VIN23 (MISCELLANEOUS) IMPLANT
KIT DEFENDO VALVE AND CONN (KITS) IMPLANT
KIT PRC NS LF DISP ENDO (KITS) ×1 IMPLANT
MANIFOLD NEPTUNE II (INSTRUMENTS) ×2 IMPLANT
MARKER SPOT ENDO TATTOO 5ML (MISCELLANEOUS) IMPLANT
PROBE APC STR FIRE (PROBE) IMPLANT
RETRIEVER NET ROTH 2.5X230 LF (MISCELLANEOUS) IMPLANT
SNARE COLD EXACTO (MISCELLANEOUS) IMPLANT
SNARE SHORT THROW 13M SML OVAL (MISCELLANEOUS) IMPLANT
SNARE SNG USE RND 15MM (INSTRUMENTS) IMPLANT
TRAP ETRAP POLY (MISCELLANEOUS) IMPLANT
VARIJECT INJECTOR VIN23 (MISCELLANEOUS)
WATER STERILE IRR 250ML POUR (IV SOLUTION) ×1 IMPLANT

## 2023-03-05 NOTE — H&P (Signed)
 Rogelia Copping, MD [facility] [address], [address] [place], [place] [address] Phone:[phone] Fax : [phone]  Primary Care Physician:  [facility] Primary Gastroenterologist:  Dr. Copping  Pre-Procedure History & Physical: HPI:  Sarah Pham is a 58 y.o. female is here for an colonoscopy.   Past Medical History:  Diagnosis Date   Hyperlipidemia    Left arm weakness    due to recent RTR   Obesity    PONV (postoperative nausea and vomiting)    Sleep apnea    CPAP   Tick bite    this caused the patient not to be able to eat meat any longer   Wears contact lenses     Past Surgical History:  Procedure Laterality Date   COLONOSCOPY WITH PROPOFOL  N/A 03/04/2018   Procedure: COLONOSCOPY WITH BIOPSIES;  Surgeon: Copping Rogelia, MD;  Location: [facility];  Service: Endoscopy;  Laterality: N/A;  sleep apnea   DEBRIDEMENT AND CLOSURE WOUND N/A 05/22/2020   Procedure: Excision of abdominal wound with closure;  Surgeon: Lowery Estefana GORMAN, DO;  Location: [facility];  Service: Plastics;  Laterality: N/A;   FOOT SURGERY     HIATAL HERNIA REPAIR  02/2022   LAPAROSCOPIC GASTRIC SLEEVE RESECTION     NASAL SINUS SURGERY     OOPHORECTOMY Left    right was not removed   POLYPECTOMY N/A 03/04/2018   Procedure: POLYPECTOMY;  Surgeon: Copping Rogelia, MD;  Location: [facility];  Service: Endoscopy;  Laterality: N/A;   ROTATOR CUFF REPAIR Left 12/31/2017   [facility].   VAGINAL HYSTERECTOMY  1995    Prior to Admission medications   Medication Sig Start Date End Date Taking? Authorizing Provider  furosemide  (LASIX ) 20 MG tablet TAKE 1 TABLET BY MOUTH AS NEEDED 02/26/23  Yes Emilio Kelly DASEN, FNP  Multiple Vitamins-Minerals (BARIATRIC MULTIVITAMINS/IRON) CAPS Take 1 tablet by mouth daily.    Yes [provider]  rosuvastatin  (CRESTOR ) 20 MG tablet Take 1 tablet (20 mg total) by mouth daily. 11/10/22  Yes Emilio Kelly DASEN, FNP   Semaglutide-Weight Management (WEGOVY) 0.25 MG/0.5ML SOAJ Inject into the skin. Injecting 2.4 mg 08/29/21  Yes [provider]  BIOTIN PO Take 2 Doses/Fill by mouth daily.  Patient not taking: Reported on 02/25/2023    [provider]  EPINEPHrine  0.3 mg/0.3 mL IJ SOAJ injection Inject 0.3 mg into the muscle as needed for anaphylaxis. 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   Other Other (See Comments) 11/15/2014   Sulfa antibiotics Nausea And Vomiting 07/06/2014   Sulfasalazine Nausea And Vomiting 07/06/2014   Tomato Itching 07/06/2014    Family History  Problem Relation Age of Onset   Breast cancer Maternal Aunt    Breast cancer Maternal Grandmother     Social History   Socioeconomic History   Marital status: Married    Spouse name: Not on file   Number of children: Not on file   Years of education: Not on file   Highest education level: Not on file  Occupational History   Not on file  Tobacco Use   Smoking status: Every Day    Current packs/day: 1.00    Average packs/day: 1 pack/day for 33.0 years (33.0 ttl pk-yrs)    Types: Cigarettes  Start date: 02/19/1990   Smokeless tobacco: Never   Tobacco comments:    Report 6-20+ cig/day  Vaping Use   Vaping status: Never Used  Substance and Sexual Activity   Alcohol use: No    Alcohol/week: 0.0 standard drinks of alcohol   Drug use: No   Sexual activity: Yes  Other Topics Concern   Not on file  Social History Narrative   Not on file   Social Drivers of Health   Financial Resource Strain: Not on file  Food Insecurity: Not on file  Transportation Needs: Not on file  Physical Activity: Not on file  Stress: Not on file  Social Connections: Not on file  Intimate Partner Violence: Not on file    Review of Systems: See  HPI, otherwise negative ROS  Physical Exam: BP 114/74   Pulse 86   Temp 97.7 F (36.5 C) (Temporal)   Resp 12   Ht 5' 5.5 (1.664 m)   Wt 71.5 kg   SpO2 96%   BMI 25.83 kg/m  General:   Alert,  pleasant and cooperative in NAD Head:  Normocephalic and atraumatic. Neck:  Supple; no masses or thyromegaly. Lungs:  Clear throughout to auscultation.    Heart:  Regular rate and rhythm. Abdomen:  Soft, nontender and nondistended. Normal bowel sounds, without guarding, and without rebound.   Neurologic:  Alert and  oriented x4;  grossly normal neurologically.  Impression/Plan: Sarah Pham is here for an colonoscopy to be performed for a history of adenomatous polyps on 2020   Risks, benefits, limitations, and alternatives regarding  colonoscopy have been reviewed with the patient.  Questions have been answered.  All parties agreeable.   Rogelia Copping, MD  03/05/2023, 8:36 AM

## 2023-03-05 NOTE — Transfer of Care (Signed)
 Immediate Anesthesia Transfer of Care Note  Patient: Sarah Pham  Procedure(s) Performed: COLONOSCOPY WITH PROPOFOL  (Rectum)  Patient Location: PACU  Anesthesia Type:General  Level of Consciousness: sedated  Airway & Oxygen  Therapy: Patient Spontanous Breathing  Post-op Assessment: Report given to RN and Post -op Vital signs reviewed and stable  Post vital signs: Reviewed and stable  Last Vitals: See PACU flow sheet for normal temp Vitals Value Taken Time  BP 101/69 03/05/23 0912  Temp    Pulse 77 03/05/23 0913  Resp 18 03/05/23 0913  SpO2 95 % 03/05/23 0913  Vitals shown include unfiled device data.  Last Pain:  Vitals:   03/05/23 0749  TempSrc: Temporal  PainSc: 0-No pain         Complications: No notable events documented.

## 2023-03-05 NOTE — Anesthesia Postprocedure Evaluation (Signed)
 Anesthesia Post Note  Patient: Sarah Pham  Procedure(s) Performed: COLONOSCOPY WITH PROPOFOL  (Rectum)  Patient location during evaluation: PACU Anesthesia Type: General Level of consciousness: awake and alert Pain management: pain level controlled Vital Signs Assessment: post-procedure vital signs reviewed and stable Respiratory status: spontaneous breathing, nonlabored ventilation, respiratory function stable and patient connected to nasal cannula oxygen  Cardiovascular status: blood pressure returned to baseline and stable Postop Assessment: no apparent nausea or vomiting Anesthetic complications: no   No notable events documented.   Last Vitals:  Vitals:   03/05/23 0929 03/05/23 0930  BP: 112/72   Pulse: 74 72  Resp: 13 14  Temp:  (!) 36.2 C  SpO2: 95% 97%    Last Pain:  Vitals:   03/05/23 0930  TempSrc:   PainSc: 10-Worst pain ever                 Donny JAYSON Mu

## 2023-03-05 NOTE — Op Note (Signed)
 [facility] Patient Name: Sarah Pham Procedure Date: 03/05/2023 8:36 AM MRN: [id number] Account #: [id number] Date of Birth: 12-16-1965 Admit Type: Outpatient Age: 58 Room: [facility] OR ROOM 01 Gender: Female Note Status: Finalized Instrument Name: [id number] Procedure:             Colonoscopy Indications:           High risk colon cancer surveillance: Personal history                         of colonic polyps Providers:             Rogelia Copping MD, MD Medicines:             Propofol  per Anesthesia Complications:         No immediate complications. Procedure:             Pre-Anesthesia Assessment:                        - Prior to the procedure, a History and Physical was                         performed, and patient medications and allergies were                         reviewed. The patient's tolerance of previous                         anesthesia was also reviewed. The risks and benefits                         of the procedure and the sedation options and risks                         were discussed with the patient. All questions were                         answered, and informed consent was obtained. Prior                         Anticoagulants: The patient has taken no anticoagulant                         or antiplatelet agents. ASA Grade Assessment: II - A                         patient with mild systemic disease. After reviewing                         the risks and benefits, the patient was deemed in                         satisfactory condition to undergo the procedure.                        After obtaining informed consent, the colonoscope was                         passed under direct vision. Throughout the  procedure,                         the patient's blood pressure, pulse, and oxygen                          saturations were monitored continuously. The                         Colonoscope was introduced through the anus and                          advanced to the the cecum, identified by appendiceal                         orifice and ileocecal valve. The colonoscopy was                         performed without difficulty. The patient tolerated                         the procedure well. The quality of the bowel                         preparation was excellent. Findings:      The perianal and digital rectal examinations were normal.      Non-bleeding internal hemorrhoids were found during retroflexion. The       hemorrhoids were Grade II (internal hemorrhoids that prolapse but reduce       spontaneously). Impression:            - Non-bleeding internal hemorrhoids.                        - No specimens collected. Recommendation:        - Discharge patient to home.                        - Resume previous diet.                        - Continue present medications.                        - Repeat colonoscopy in 7 years for surveillance. Procedure Code(s):     --- Professional ---                        [phone], Colonoscopy, flexible; diagnostic, including                         collection of specimen(s) by brushing or washing, when                         performed (separate procedure) Diagnosis Code(s):     --- Professional ---                        Z86.010, Personal history of colonic polyps CPT copyright 2022 American Medical Association. All rights reserved. The codes documented in this report are preliminary and upon coder review may  be revised to meet current compliance requirements.  Rogelia Copping MD, MD 03/05/2023 9:11:40 AM This report has been signed electronically. Number of Addenda: 0 Note Initiated On: 03/05/2023 8:36 AM Scope Withdrawal Time: 0 hours 12 minutes 36 seconds  Total Procedure Duration: 0 hours 21 minutes 55 seconds  Estimated Blood Loss:  Estimated blood loss: none.      [facility]

## 2023-03-06 ENCOUNTER — Encounter: Payer: Self-pay | Admitting: Gastroenterology

## 2023-04-22 ENCOUNTER — Telehealth: Payer: Self-pay

## 2023-04-22 DIAGNOSIS — F17210 Nicotine dependence, cigarettes, uncomplicated: Secondary | ICD-10-CM

## 2023-04-22 DIAGNOSIS — Z122 Encounter for screening for malignant neoplasm of respiratory organs: Secondary | ICD-10-CM

## 2023-04-22 DIAGNOSIS — Z87891 Personal history of nicotine dependence: Secondary | ICD-10-CM

## 2023-04-22 NOTE — Telephone Encounter (Signed)
.  Lung Cancer Screening Narrative/Criteria Questionnaire (Cigarette Smokers Only- No Cigars/Pipes/vapes)   NATONYA FINSTAD   SDMV:05/05/2023 at 11:00 am with Vernona Rieger       1965/04/04   LDCT: 05/06/2023 9 am at [facility]    58 y.o.   Phone: [phone]  Lung Screening Narrative (confirm age 43-77 yrs Medicare / 50-80 yrs Private pay insurance)   Insurance information: [employer]   Referring Provider:Payne, NP   This screening involves an initial phone call with a team member from our program. It is called a shared decision making visit. The initial meeting is required by  insurance and Medicare to make sure you understand the program. This appointment takes about 15-20 minutes to complete. You will complete the screening scan at your scheduled date/time.  This scan takes about 5-10 minutes to complete. You can eat and drink normally before and after the scan.  Criteria questions for Lung Cancer Screening:   Are you a current or former smoker? Current Age began smoking: 17   If you are a former smoker, what year did you quit smoking? 2 years(within 15 yrs)   To calculate your smoking history, I need an accurate estimate of how many packs of cigarettes you smoked per day and for how many years. (Not just the number of PPD you are now smoking)   Years smoking 38 x Packs per day 1 = Pack years 40   (at least 20 pack yrs)   (Make sure they understand that we need to know how much they have smoked in the past, not just the number of PPD they are smoking now)  Do you have a personal history of cancer?  No    Do you have a family history of cancer? Yes  (cancer type and and relative) Father skin and Brother Renal   Are you coughing up blood?  No  Have you had unexplained weight loss of 15 lbs or more in the last 6 months? No  It looks like you meet all criteria.  When would be a good time for [place] to schedule you for this screening?   Additional information: N/A

## 2023-05-05 ENCOUNTER — Ambulatory Visit: Payer: 59 | Admitting: Physician Assistant

## 2023-05-05 ENCOUNTER — Encounter: Payer: Self-pay | Admitting: Physician Assistant

## 2023-05-05 DIAGNOSIS — F17210 Nicotine dependence, cigarettes, uncomplicated: Secondary | ICD-10-CM | POA: Diagnosis not present

## 2023-05-05 NOTE — Progress Notes (Signed)
 Virtual Visit via Telephone Note  I connected with Sarah Pham  on 05/05/23 at  11:07 AM by telephone and verified that I am speaking with the correct person using two identifiers.  Location: Patient: home Provider: working virtually from home   I discussed the limitations, risks, security and privacy concerns of performing an evaluation and management service by telephone and the availability of in person appointments. I also discussed with the patient that there may be a patient responsible charge related to this service. The patient expressed understanding and agreed to proceed.     Shared Decision Making Visit Lung Cancer Screening Program [phone])   Eligibility: Age 58 Pack Years Smoking History Calculation 75 (# packs/per year x # years smoked) Recent History of coughing up blood  No Unexplained weight loss? No ( >Than 15 pounds within the last 6 months ) Prior History Lung / other cancer No (Diagnosis within the last 5 years already requiring surveillance chest CT Scans). Smoking Status Current Smoker  Visit Components: Discussion included one or more decision making aids. Yes Discussion included risk/benefits of screening. Yes Discussion included potential follow up diagnostic testing for abnormal scans. Yes Discussion included meaning and risk of over diagnosis. Yes Discussion included meaning and risk of False Positives. Yes Discussion included meaning of total radiation exposure. Yes  Counseling Included: Importance of adherence to annual lung cancer LDCT screening. Yes Impact of comorbidities on ability to participate in the program. Yes Ability and willingness to under diagnostic treatment: Yes  Smoking Cessation Counseling: Current Smokers:  Discussed importance of smoking cessation. Yes Information about tobacco cessation classes and interventions provided to patient. Yes Symptomatic Patient. No Diagnosis Code: Tobacco Use Z72.0 Asymptomatic Patient  Yes  Counseling (Intermediate counseling: > three minutes counseling) [id number] Information about tobacco cessation classes and interventions provided to patient. Yes Written Order for Lung Cancer Screening with LDCT placed in Epic. Yes (CT Chest Lung Cancer Screening Low Dose W/O CM) [id number] Z12.2-Screening of respiratory organs Z87.891-Personal history of nicotine dependence   I have spent 25 minutes of face to face/ virtual visit  time with the patient discussing the risks and benefits of lung cancer screening. We discussed the above noted topics. We paused at intervals to allow for questions to be asked and answered to ensure understanding.We discussed that the single most powerful action that anyone can take to decrease their risk of developing lung cancer is to quit smoking.  We discussed options for tools to aid in quitting smoking including nicotine replacement therapy, non-nicotine medications, support groups, Quit Smart classes, and behavior modification. We discussed that often times setting smaller, more achievable goals, such as eliminating 1 cigarette a day for a week and then 2 cigarettes a day for a week can be helpful in slowly decreasing the number of cigarettes smoked. I provided  them  with smoking cessation  information  with contact information for community resources, classes, free nicotine replacement therapy, and access to mobile apps, text messaging, and on-line smoking cessation help. I have also provided  them  the office contact information in the event they have any questions. We discussed the time and location of the scan, and that either Sarah Miyamoto RN, Sarah Lemon, RN  or I will call / send a letter with the results within 24-72 hours of receiving them. The patient verbalized understanding of all of  the above and had no further questions upon leaving the office. They have my contact information in the event they have any  further questions.  I spent 3 minutes counseling  on smoking cessation and the health risks of continued tobacco abuse.  I explained to the patient that there has been a high incidence of coronary artery disease noted on these exams. I explained that this is a non-gated exam therefore degree or severity cannot be determined. This patient is on statin therapy. I have asked the patient to follow-up with their PCP regarding any incidental finding of coronary artery disease and management with diet or medication as their PCP  feels is clinically indicated. The patient verbalized understanding of the above and had no further questions upon completion of the visit.    Sarah Gasman [name], PA-C

## 2023-05-05 NOTE — Patient Instructions (Signed)

## 2023-05-06 ENCOUNTER — Telehealth: Payer: Self-pay | Admitting: Family Medicine

## 2023-05-06 ENCOUNTER — Ambulatory Visit
Admission: RE | Admit: 2023-05-06 | Discharge: 2023-05-06 | Disposition: A | Discharge status: Home / Self Care | Payer: 59 | Source: Ambulatory Visit | Attending: Acute Care | Admitting: Acute Care

## 2023-05-06 DIAGNOSIS — Z87891 Personal history of nicotine dependence: Secondary | ICD-10-CM | POA: Insufficient documentation

## 2023-05-06 DIAGNOSIS — F17210 Nicotine dependence, cigarettes, uncomplicated: Secondary | ICD-10-CM | POA: Insufficient documentation

## 2023-05-06 DIAGNOSIS — Z122 Encounter for screening for malignant neoplasm of respiratory organs: Secondary | ICD-10-CM | POA: Insufficient documentation

## 2023-05-06 DIAGNOSIS — R609 Edema, unspecified: Secondary | ICD-10-CM

## 2023-05-06 MED ORDER — FUROSEMIDE 20 MG PO TABS: ORAL_TABLET | ORAL | 0 refills | Status: DC

## 2023-05-06 NOTE — Telephone Encounter (Signed)
 [facility] pharmacy is requesting refill furosemide (LASIX) 20 MG tablet   Please advise

## 2023-06-01 ENCOUNTER — Other Ambulatory Visit: Payer: Self-pay

## 2023-06-01 DIAGNOSIS — Z122 Encounter for screening for malignant neoplasm of respiratory organs: Secondary | ICD-10-CM

## 2023-06-01 DIAGNOSIS — Z87891 Personal history of nicotine dependence: Secondary | ICD-10-CM

## 2023-06-01 DIAGNOSIS — F17210 Nicotine dependence, cigarettes, uncomplicated: Secondary | ICD-10-CM

## 2023-07-05 ENCOUNTER — Other Ambulatory Visit: Payer: Self-pay | Admitting: Family Medicine

## 2023-07-05 DIAGNOSIS — R609 Edema, unspecified: Secondary | ICD-10-CM

## 2023-07-06 NOTE — Telephone Encounter (Signed)
 Requested medication (s) are due for refill today: yes  Requested medication (s) are on the active medication list: yes  Last refill:  05/06/23 #30  Future visit scheduled: yes  Notes to clinic:  overdue lab work   Requested Prescriptions  Pending Prescriptions Disp Refills   furosemide  (LASIX ) 20 MG tablet [Pharmacy Med Name: FUROSEMIDE  20MG  TABLETS] 30 tablet 0    Sig: TAKE 1 TABLET BY MOUTH AS NEEDED     Cardiovascular:  Diuretics - Loop Failed - 07/06/2023  3:49 PM      Failed - K in normal range and within 180 days    Potassium  Date Value Ref Range Status  10/05/2022 4.4 3.5 - 5.1 mEq/L Final         Failed - Ca in normal range and within 180 days    Calcium   Date Value Ref Range Status  10/05/2022 10.3 8.7 - 10.7 Final         Failed - Na in normal range and within 180 days    Sodium  Date Value Ref Range Status  10/05/2022 142 137 - 147 Final         Failed - Cr in normal range and within 180 days    Creatinine  Date Value Ref Range Status  10/05/2022 0.7 0.5 - 1.1 Final   Creatinine, Ser  Date Value Ref Range Status  09/12/2021 0.71 0.57 - 1.00 mg/dL Final         Failed - Cl in normal range and within 180 days    Chloride  Date Value Ref Range Status  10/05/2022 99 99 - 108 Final         Failed - Mg Level in normal range and within 180 days    No results found for: "MG"       Failed - Valid encounter within last 6 months    Recent Outpatient Visits   None     Future Appointments             In 4 months Pham, Sarah Blacksmith, DO [facility]            Passed - Last BP in normal range    BP Readings from Last 1 Encounters:  03/05/23 112/72

## 2023-11-11 ENCOUNTER — Encounter: Payer: Self-pay | Admitting: Family Medicine

## 2023-11-11 ENCOUNTER — Ambulatory Visit: Payer: Self-pay | Admitting: Family Medicine

## 2023-11-11 VITALS — BP 124/73 | HR 67 | Temp 98.3°F | Ht 66.0 in | Wt 174.4 lb

## 2023-11-11 DIAGNOSIS — Z1231 Encounter for screening mammogram for malignant neoplasm of breast: Secondary | ICD-10-CM

## 2023-11-11 DIAGNOSIS — E782 Mixed hyperlipidemia: Secondary | ICD-10-CM

## 2023-11-11 DIAGNOSIS — G479 Sleep disorder, unspecified: Secondary | ICD-10-CM

## 2023-11-11 DIAGNOSIS — R600 Localized edema: Secondary | ICD-10-CM

## 2023-11-11 DIAGNOSIS — Z0001 Encounter for general adult medical examination with abnormal findings: Secondary | ICD-10-CM | POA: Diagnosis not present

## 2023-11-11 DIAGNOSIS — Z0000 Encounter for general adult medical examination without abnormal findings: Secondary | ICD-10-CM

## 2023-11-11 DIAGNOSIS — R7303 Prediabetes: Secondary | ICD-10-CM

## 2023-11-11 DIAGNOSIS — Z716 Tobacco abuse counseling: Secondary | ICD-10-CM

## 2023-11-11 DIAGNOSIS — E8809 Other disorders of plasma-protein metabolism, not elsewhere classified: Secondary | ICD-10-CM

## 2023-11-11 DIAGNOSIS — Z9884 Bariatric surgery status: Secondary | ICD-10-CM

## 2023-11-11 DIAGNOSIS — F17200 Nicotine dependence, unspecified, uncomplicated: Secondary | ICD-10-CM | POA: Diagnosis not present

## 2023-11-11 DIAGNOSIS — R519 Headache, unspecified: Secondary | ICD-10-CM

## 2023-11-11 MED ORDER — FUROSEMIDE 20 MG PO TABS: ORAL_TABLET | ORAL | 3 refills | Status: AC

## 2023-11-11 MED ORDER — EPINEPHRINE 0.3 MG/0.3ML IJ SOAJ: 0.3000 mg | INTRAMUSCULAR | 4 refills | Status: AC | PRN

## 2023-11-11 MED ORDER — ROSUVASTATIN CALCIUM 20 MG PO TABS: 20.0000 mg | ORAL_TABLET | Freq: Every day | ORAL | 3 refills | Status: AC

## 2023-11-11 NOTE — Progress Notes (Unsigned)
 Complete physical exam   Patient: Sarah Pham   DOB: 10/04/1965   58 y.o. Female  MRN: [id number] Visit Date: 11/11/2023  Today's healthcare provider: LAURAINE LOISE BUOY, DO   Chief Complaint  Patient presents with   Annual Exam    Diet- Low carbs and severe allergies to meat Exercise- Walk at least a mile or 2 miles every other day Overall Feeling- Good Sleep- Does not sleep at all, sleep an hour and work a 16 hour day Concerns- None  Thinks she is in menopause.  All vaccines- declined  Will need refills for medications.   Subjective    Sarah Pham is a 58 y.o. female who presents today for a complete physical exam.   HPI HPI     Annual Exam    Additional comments: Diet- Low carbs and severe allergies to meat Exercise- Walk at least a mile or 2 miles every other day Overall Feeling- Good Sleep- Does not sleep at all, sleep an hour and work a 16 hour day Concerns- None  Thinks she is in menopause.  All vaccines- declined  Will need refills for medications.      Last edited by Terrel Powell CROME, CMA on 11/11/2023  8:21 AM.       Sarah Pham is a 58 year old female who presents for her annual physical.  Recent blood work showed elevated hemoglobin and white blood cell counts. She has been referred to a hematologist in the past for elevated hemoglobin levels. She is on [facility] for weight management following her weight loss surgery and is under the care of a weight loss doctor.  She recently had blood work done through her Teacher, English as a foreign language and will be dropping off copies of the records later today or tomorrow.  She experiences frequent headaches, which she attributes to potential hormonal changes, as she is undergoing evaluation for menopause. She takes Tylenol  for headache relief, avoiding NSAIDs due to her surgery. She had a hysterectomy at age 37 and later removal of her left ovary and tube.  She has been experiencing right-sided breast pain for the past  two weeks, described as a constant, sharp sensation around the nipple area. Initially, the pain was persistent for a week and is now intermittent. There is a family history of breast cancer, with her grandmother and aunt affected, though her mother and sisters have not had the disease. She has not had any abnormal mammograms or Pap smears in the past.  She has been smoking since 1987, currently smoking half a pack to a pack a day, and has no interest in quitting. There is a family history of lung cancer, with her grandmother having died from it despite never smoking. She occasionally consumes alcohol, typically during social events.  She reports numbness from her waist down to her leg since 2022, following her surgery. She also mentions having postnasal drip.   She is currently taking Lasix  as needed for fluid retention and mentioned that her father also retains fluid. She notes swelling in her ankles.  She has declined vaccines, including the flu, shingles, and pneumonia shots, due to concerns about past reactions and efficacy. She had a bad experience with the COVID-19 vaccine, having contracted COVID-19 despite vaccination. She also notes that her mother had a bad reaction to the shingles vaccine, which causes her to be very wary of it.    Past Medical History:  Diagnosis Date   Allergy 10/25/2011   Alpha gal  GERD (gastroesophageal reflux disease) 2021   Hyperlipidemia    Left arm weakness    due to recent RTR   Obesity    PONV (postoperative nausea and vomiting)    Sleep apnea    CPAP   Tick bite    this caused the patient not to be able to eat meat any longer   Wears contact lenses    Past Surgical History:  Procedure Laterality Date   COLONOSCOPY WITH PROPOFOL  N/A 03/04/2018   Procedure: COLONOSCOPY WITH BIOPSIES;  Surgeon: Jinny Carmine, MD;  Location: [facility];  Service: Endoscopy;  Laterality: N/A;  sleep apnea   COLONOSCOPY WITH PROPOFOL  N/A 03/05/2023    Procedure: COLONOSCOPY WITH PROPOFOL ;  Surgeon: Jinny Carmine, MD;  Location: [facility];  Service: Endoscopy;  Laterality: N/A;   COSMETIC SURGERY  04/29/20/& 05/22/20   DEBRIDEMENT AND CLOSURE WOUND N/A 05/22/2020   Procedure: Excision of abdominal wound with closure;  Surgeon: Lowery Estefana RAMAN, DO;  Location: [facility];  Service: Plastics;  Laterality: N/A;   FOOT SURGERY     HERNIA REPAIR  05/22/2020   HIATAL HERNIA REPAIR  02/2022   LAPAROSCOPIC GASTRIC SLEEVE RESECTION     NASAL SINUS SURGERY     OOPHORECTOMY Left    right was not removed   POLYPECTOMY N/A 03/04/2018   Procedure: POLYPECTOMY;  Surgeon: Jinny Carmine, MD;  Location: [facility];  Service: Endoscopy;  Laterality: N/A;   ROTATOR CUFF REPAIR Left 12/31/2017   [facility] Dr [facility].   TUBAL LIGATION  1993   VAGINAL HYSTERECTOMY  1995   Social History   Socioeconomic History   Marital status: Married    Spouse name: Not on file   Number of children: Not on file   Years of education: Not on file   Highest education level: Not on file  Occupational History   Not on file  Tobacco Use   Smoking status: Every Day    Current packs/day: 1.00    Average packs/day: 1 pack/day for 38.5 years (38.5 ttl pk-yrs)    Types: Cigarettes    Start date: 05/04/1985   Smokeless tobacco: Never   Tobacco comments:    Report 6-20+ cig/day  Vaping Use   Vaping status: Never Used  Substance and Sexual Activity   Alcohol use: No    Alcohol/week: 0.0 standard drinks of alcohol   Drug use: No   Sexual activity: Yes  Other Topics Concern   Not on file  Social History Narrative   Not on file   Social Drivers of Health   Financial Resource Strain: Not on file  Food Insecurity: Not on file  Transportation Needs: Not on file  Physical Activity: Not on file  Stress: Not on file  Social Connections: Not on file  Intimate Partner Violence: Not on file   Family Status  Relation Name  Status   Mother  Alive   Father  Alive   Mat Aunt  (Not Specified)   MGM  (Not Specified)  No partnership data on file   Family History  Problem Relation Age of Onset   Breast cancer Maternal Aunt    Breast cancer Maternal Grandmother    Allergies  Allergen Reactions   Alpha-Gal Anaphylaxis   Galactose Anaphylaxis   Grape Seed Anaphylaxis   Other Other (See Comments)    Tomatoes, dust and mold. Patient has Alpha Gal- Allergic to all hooved animal   Sulfa Antibiotics Nausea And Vomiting  Sulfasalazine Nausea And Vomiting   Tomato Itching    Itchy throat    Patient Care Team: Donzella Lauraine SAILOR, DO as PCP - General (Family Medicine)   Medications: Outpatient Medications Prior to Visit  Medication Sig   Multiple Vitamins-Minerals (BARIATRIC MULTIVITAMINS/IRON) CAPS Take 1 tablet by mouth daily.    Semaglutide-Weight Management (WEGOVY) 0.25 MG/0.5ML SOAJ Inject into the skin. Injecting 2.4 mg   [DISCONTINUED] BIOTIN PO Take 2 Doses/Fill by mouth daily.    [DISCONTINUED] EPINEPHrine  0.3 mg/0.3 mL IJ SOAJ injection Inject 0.3 mg into the muscle as needed for anaphylaxis.   [DISCONTINUED] furosemide  (LASIX ) 20 MG tablet TAKE 1 TABLET BY MOUTH AS NEEDED   [DISCONTINUED] rosuvastatin  (CRESTOR ) 20 MG tablet Take 1 tablet (20 mg total) by mouth daily.   No facility-administered medications prior to visit.    Review of Systems  Constitutional:  Negative for chills, fatigue and fever.  HENT:  Negative for congestion, ear pain, rhinorrhea, sneezing and sore throat.   Eyes: Negative.  Negative for pain and redness.  Respiratory:  Negative for cough, shortness of breath and wheezing.   Cardiovascular:  Negative for chest pain and leg swelling.  Gastrointestinal:  Negative for abdominal pain, blood in stool, constipation, diarrhea and nausea.  Endocrine: Negative for polydipsia and polyphagia.  Genitourinary: Negative.  Negative for dysuria, flank pain, hematuria, pelvic pain, vaginal  bleeding and vaginal discharge.  Musculoskeletal:  Negative for arthralgias, back pain, gait problem and joint swelling.  Skin:  Negative for rash.  Neurological:  Positive for headaches. Negative for dizziness, tremors, seizures, weakness, light-headedness and numbness.  Hematological:  Negative for adenopathy.  Psychiatric/Behavioral: Negative.  Negative for behavioral problems, confusion and dysphoric mood. The patient is not nervous/anxious and is not hyperactive.    {Insert previous labs (optional):23779} {See past labs  Heme  Chem  Endocrine  Serology  Results Review (optional):1}  Objective    BP 124/73 (BP Location: Right Arm, Patient Position: Sitting, Cuff Size: Normal)   Pulse 67   Temp 98.3 F (36.8 C) (Oral)   Ht 5' 6 (1.676 m)   Wt 174 lb 6.4 oz (79.1 kg)   SpO2 100%   BMI 28.15 kg/m  {Insert last BP/Wt (optional):23777}{See vitals history (optional):1}  Physical Exam Vitals and nursing note reviewed.  Constitutional:      General: She is awake.     Appearance: Normal appearance.  HENT:     Head: Normocephalic and atraumatic.     Right Ear: Tympanic membrane, ear canal and external ear normal.     Left Ear: Tympanic membrane, ear canal and external ear normal.     Nose: Nose normal.     Mouth/Throat:     Mouth: Mucous membranes are moist.     Pharynx: Oropharynx is clear. No oropharyngeal exudate or posterior oropharyngeal erythema.  Eyes:     General: No scleral icterus.    Extraocular Movements: Extraocular movements intact.     Conjunctiva/sclera: Conjunctivae normal.     Pupils: Pupils are equal, round, and reactive to light.  Neck:     Thyroid: No thyromegaly or thyroid tenderness.  Cardiovascular:     Rate and Rhythm: Normal rate and regular rhythm.     Pulses: Normal pulses.     Heart sounds: Normal heart sounds.  Pulmonary:     Effort: Pulmonary effort is normal. No tachypnea, bradypnea or respiratory distress.     Breath sounds: Normal  breath sounds. No stridor. No wheezing, rhonchi or rales.  Chest:       Comments: Line of tenderness noted in red, with area that is mildly tender to palpation noted in pink.  No palpable abnormality. Abdominal:     General: Bowel sounds are normal. There is no distension.     Palpations: Abdomen is soft. There is no mass.     Tenderness: There is no abdominal tenderness. There is no guarding.     Hernia: No hernia is present.  Musculoskeletal:     Cervical back: Normal range of motion and neck supple.     Right lower leg: No edema.     Left lower leg: No edema.  Lymphadenopathy:     Cervical: No cervical adenopathy.  Skin:    General: Skin is warm and dry.  Neurological:     Mental Status: She is alert and oriented to person, place, and time. Mental status is at baseline.  Psychiatric:        Mood and Affect: Mood normal.        Behavior: Behavior normal.      Last depression screening scores    11/11/2023    8:15 AM 11/10/2022   10:11 AM 09/12/2021    9:57 AM  PHQ 2/9 Scores  PHQ - 2 Score 0 0 0  PHQ- 9 Score 5     Last fall risk screening    11/11/2023    8:15 AM  Fall Risk   Falls in the past year? 0  Number falls in past yr: 0  Risk for fall due to : No Fall Risks   Last Audit-C alcohol use screening    11/11/2023    8:46 AM  Alcohol Use Disorder Test (AUDIT)  1. How often do you have a drink containing alcohol? 1  2. How many drinks containing alcohol do you have on a typical day when you are drinking? 0  3. How often do you have six or more drinks on one occasion? 0  AUDIT-C Score 1   A score of 3 or more in women, and 4 or more in men indicates increased risk for alcohol abuse, EXCEPT if all of the points are from question 1   No results found for any visits on 11/11/23.  Assessment & Plan    Routine Health Maintenance and Physical Exam  Exercise Activities and Dietary recommendations  Goals   None     Immunization History  Administered Date(s)  Administered   Influenza,inj,Quad PF,6+ Mos 12/22/2017, 12/11/2018   PFIZER(Purple Top)SARS-COV-2 Vaccination 06/05/2019, 06/15/2019   Td 02/23/2005   Tdap 07/22/2018    Health Maintenance  Topic Date Due   Influenza Vaccine  05/23/2024 (Originally 09/24/2023)   COVID-19 Vaccine (3 - 2025-26 season) 11/07/2024 (Originally 10/25/2023)   Zoster Vaccines- Shingrix (1 of 2) 11/07/2024 (Originally 02/19/2018)   Pneumococcal Vaccine: 50+ Years (1 of 2 - PCV) 11/10/2024 (Originally 02/20/1987)   Hepatitis B Vaccines 19-59 Average Risk (1 of 3 - 19+ 3-dose series) 11/10/2024 (Originally 02/20/1987)   Lung Cancer Screening  05/05/2024   Mammogram  12/06/2024   DTaP/Tdap/Td (3 - Td or Tdap) 07/21/2028   Colonoscopy  03/04/2030   Hepatitis C Screening  Completed   HIV Screening  Completed   HPV VACCINES  Aged Out   Meningococcal B Vaccine  Aged Out    Discussed health benefits of physical activity, and encouraged her to engage in regular exercise appropriate for her age and condition.   Annual physical exam  Alpha galactosidase deficiency -  EPINEPHrine ; Inject 0.3 mg into the muscle as needed for anaphylaxis.  Dispense: 2 each; Refill: 4  Status post bariatric surgery  Prediabetes  Mixed hyperlipidemia -     Rosuvastatin  Calcium ; Take 1 tablet (20 mg total) by mouth daily.  Dispense: 90 tablet; Refill: 3  Nicotine dependence with current use  Encounter for smoking cessation counseling  Encounter for screening mammogram for breast cancer -     3D Screening Mammogram, Left and Right; Future  Swelling -     Furosemide ; TAKE 1 TABLET BY MOUTH AS NEEDED  Dispense: 90 tablet; Refill: 3     Annual physical exam Physical exam overall unremarkable except as noted above. She recently had blood work done through her Teacher, English as a foreign language and will be dropping off copies of the records later today or tomorrow.  Discussed vaccines today, which patient declined.  Bilateral lower extremity  edema Fluid retention previously managed with Lasix . Currently out of medication. - Prescribe Lasix  as needed with a 90-day supply.  Mixed hyperlipidemia Managed with rosuvastatin . Insurance contacted for refills. - Send prescription for rosuvastatin  to [facility] in [place].  Nicotine dependence; smoking cessation counseling Smoking half to one pack daily since 1987. No interest in quitting despite awareness of health risks.  Insomnia Reports not sleeping, possibly related to hormonal changes. Hormone panel testing for menopause ongoing. Defer to ordering physician (her bariatric surgeon)  Headache Recent headaches lasting up to two days, possibly hormonal. Managed with Tylenol , avoids NSAIDs due to bariatric surgery. Continue to monitor.  Status post bariatric surgery  Post weight loss surgery with malabsorption issues. Under care of weight loss doctor with regular blood work. - Obtain recent blood work results from weight loss doctor to integrate into her chart.  Alpha galactosidase deficiency Noted. No acute concerns.  Continue to monitor.   Prediabetes; mixed hyperlipidemia Chronic. Awaiting blood work results from previous provider, which patient will be dropping off later today or tomorrow.   ***  Return in about 1 year (around 11/10/2024) for CPE.     I discussed the assessment and treatment plan with the patient  The patient was provided an opportunity to ask questions and all were answered. The patient agreed with the plan and demonstrated an understanding of the instructions.   The patient was advised to call back or seek an in-person evaluation if the symptoms worsen or if the condition fails to improve as anticipated.    LAURAINE LOISE BUOY, DO  [facility] [phone] (phone) [phone] (fax)  [facility]

## 2023-11-12 ENCOUNTER — Encounter: Payer: Self-pay | Admitting: Family Medicine

## 2023-12-08 ENCOUNTER — Ambulatory Visit
Admission: RE | Admit: 2023-12-08 | Discharge: 2023-12-08 | Disposition: A | Discharge status: Home / Self Care | Source: Ambulatory Visit | Attending: Family Medicine | Admitting: Family Medicine

## 2023-12-08 DIAGNOSIS — Z1231 Encounter for screening mammogram for malignant neoplasm of breast: Secondary | ICD-10-CM | POA: Insufficient documentation

## 2023-12-13 ENCOUNTER — Ambulatory Visit: Payer: Self-pay | Admitting: Family Medicine

## 2024-11-13 ENCOUNTER — Encounter: Admitting: Family Medicine
